# Patient Record
Sex: Female | Born: 1945 | Race: White | Hispanic: No | Marital: Married
Health system: Northeastern US, Academic
[De-identification: ages and names within clinical notes are randomized; demographics above are authoritative.]

---

## 2013-04-02 IMAGING — DX CERVICAL SPINE 4 VIEWS
1 series · 4 of 4 positions shown · non-contrast
Comparison: none

CERVICAL SPINE, AP, LATERAL AND OBLIQUE VIEWS, 04/02/13:
HISTORY: Pain without trauma.

[Series 1: lateral · U · 0.14mm/px · 4 of 4 slices shown]
[im 1/4]
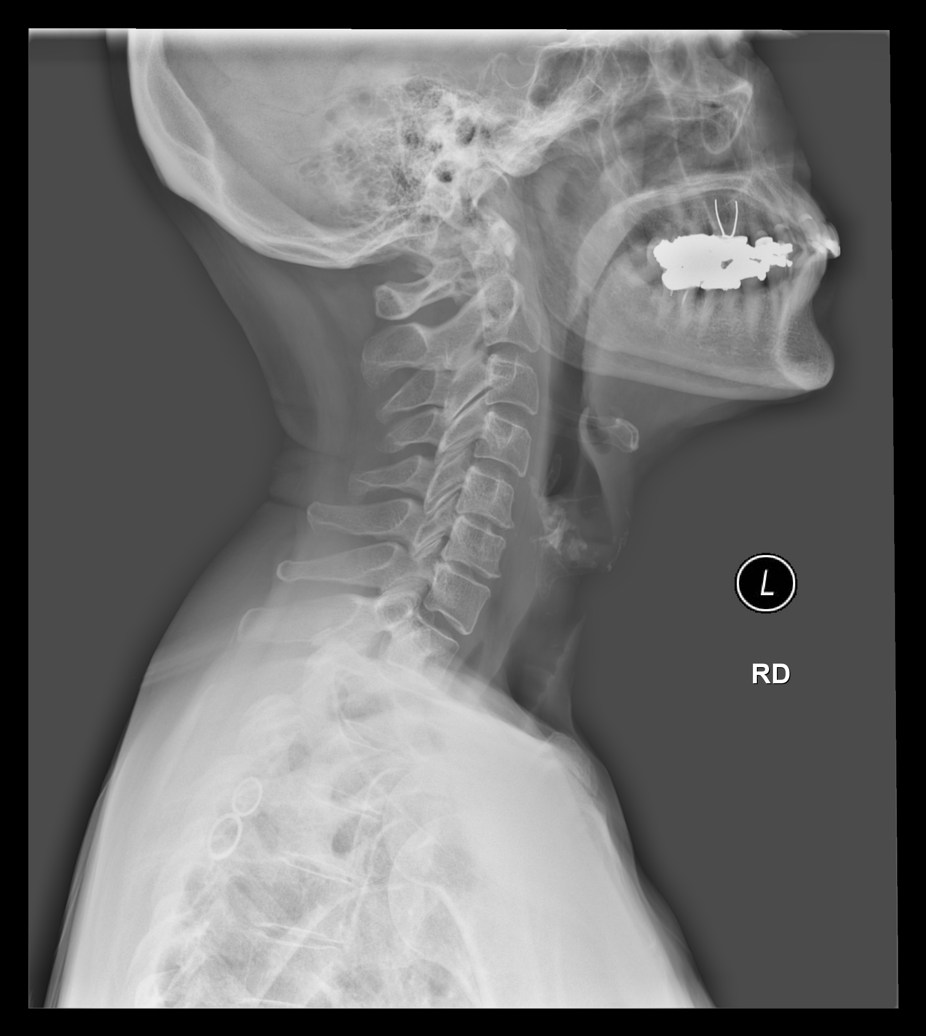
[im 2/4]
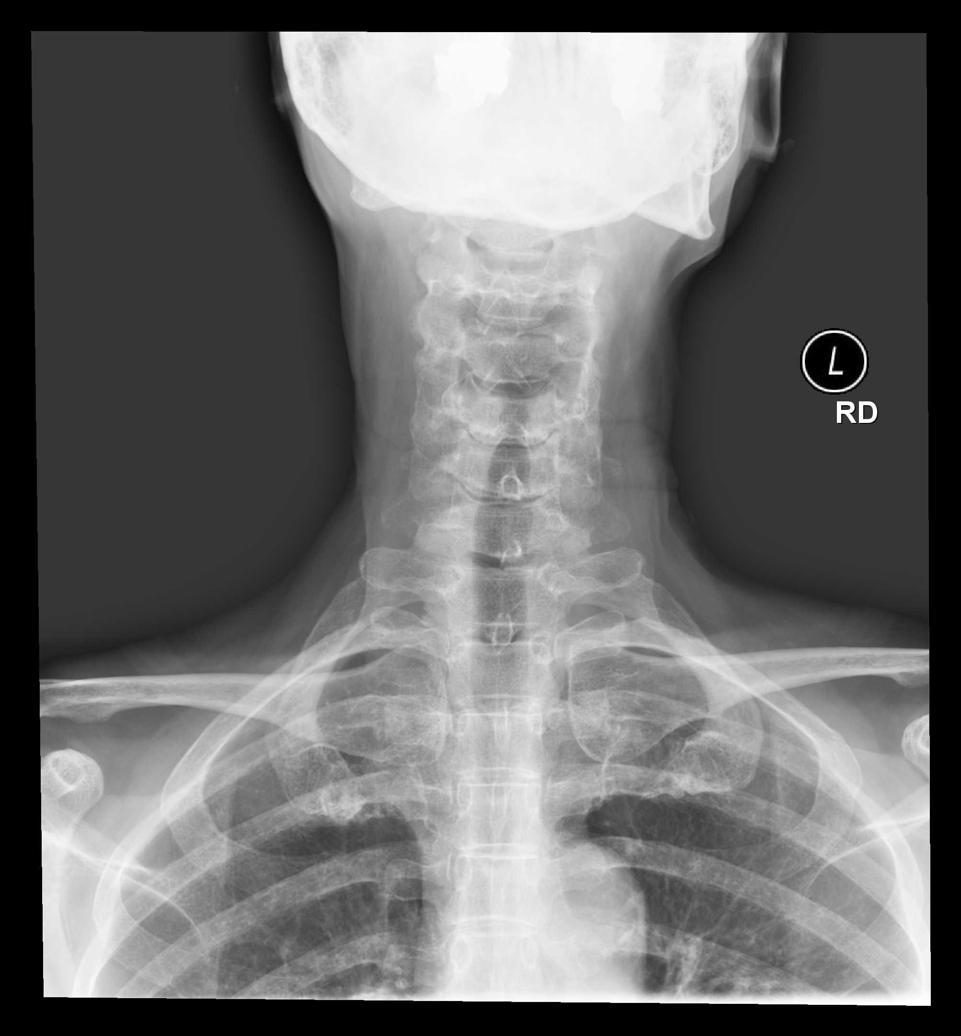
[im 3/4]
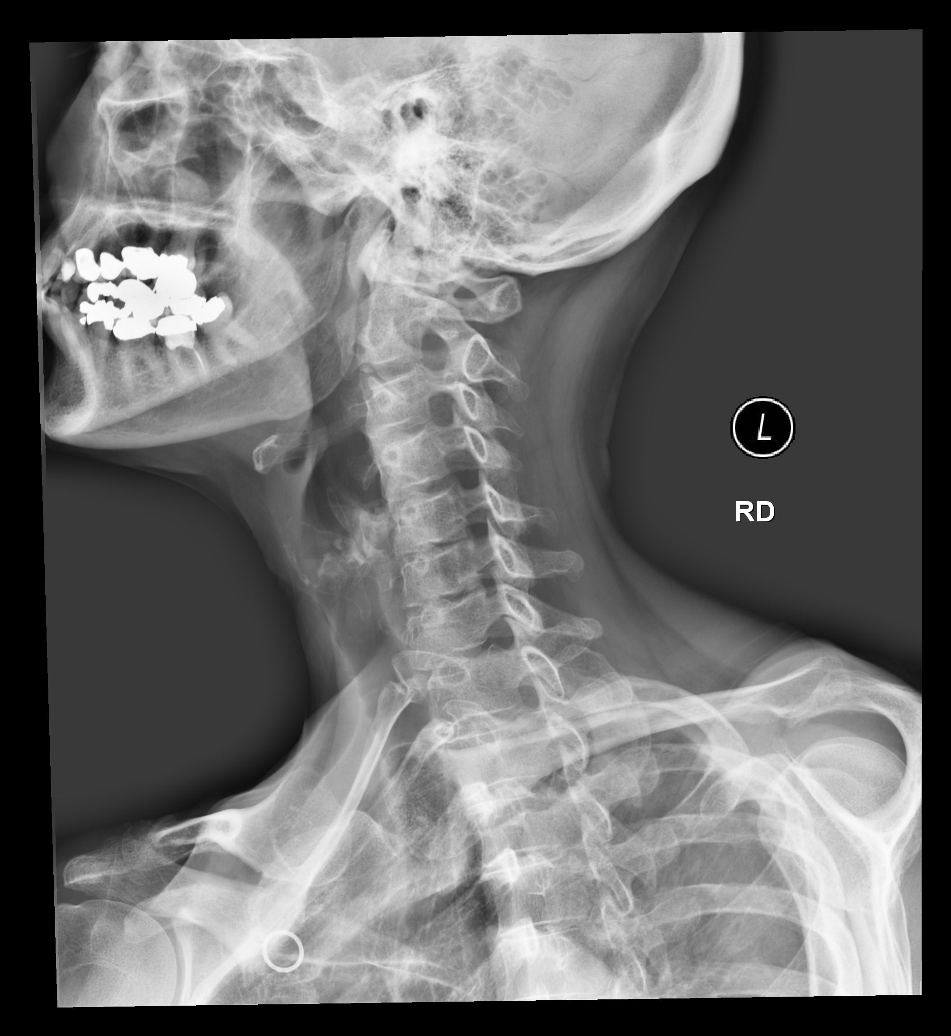
[im 4/4]
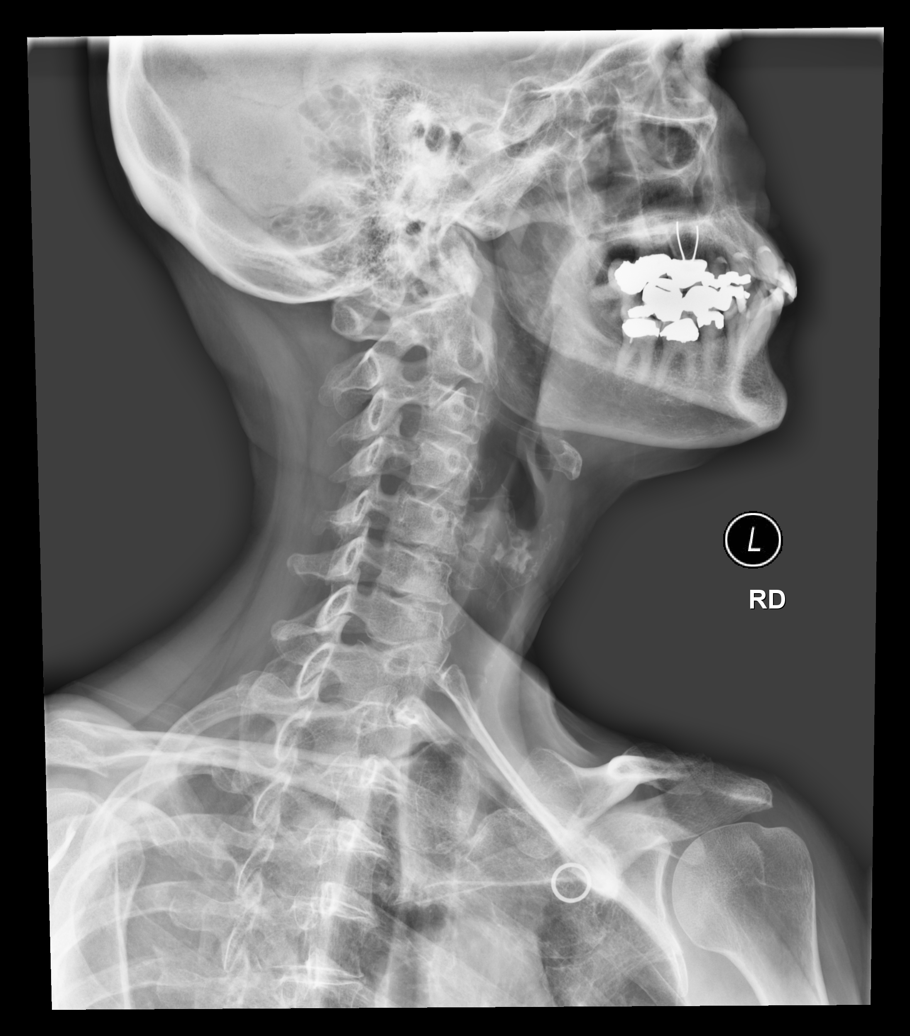

[4 of 4 positions shown; findings below may reference images not displayed]

FINDINGS: There is slight cervical dextrocurvature which could be
positional or reflect a mild degree of spasm.  Oblique views show mild left
C6-7 foraminal narrowing due to uncovertebral spurring.  Right-sided
foramina are open.  There is moderate disc narrowing at C6-7, mild to
moderate at C5-6.  There is slight retrolisthesis at C5-6.  There is no
evidence for fracture.  The dens is intact.
IMPRESSION: Spondylosis.  There is mild left C6-7 foraminal stenosis.  No evidence for
fracture.

## 2015-04-25 IMAGING — DX ABDOMEN 1 VIEW
1 series · 1 of 1 positions shown · non-contrast
Comparison: None prior.

ABDOMEN 1 VIEW, 04/25/2015 [DATE]:
CLINICAL INDICATION: Low back pain which radiates anteriorly with bladder pain
for one month.

[AP]
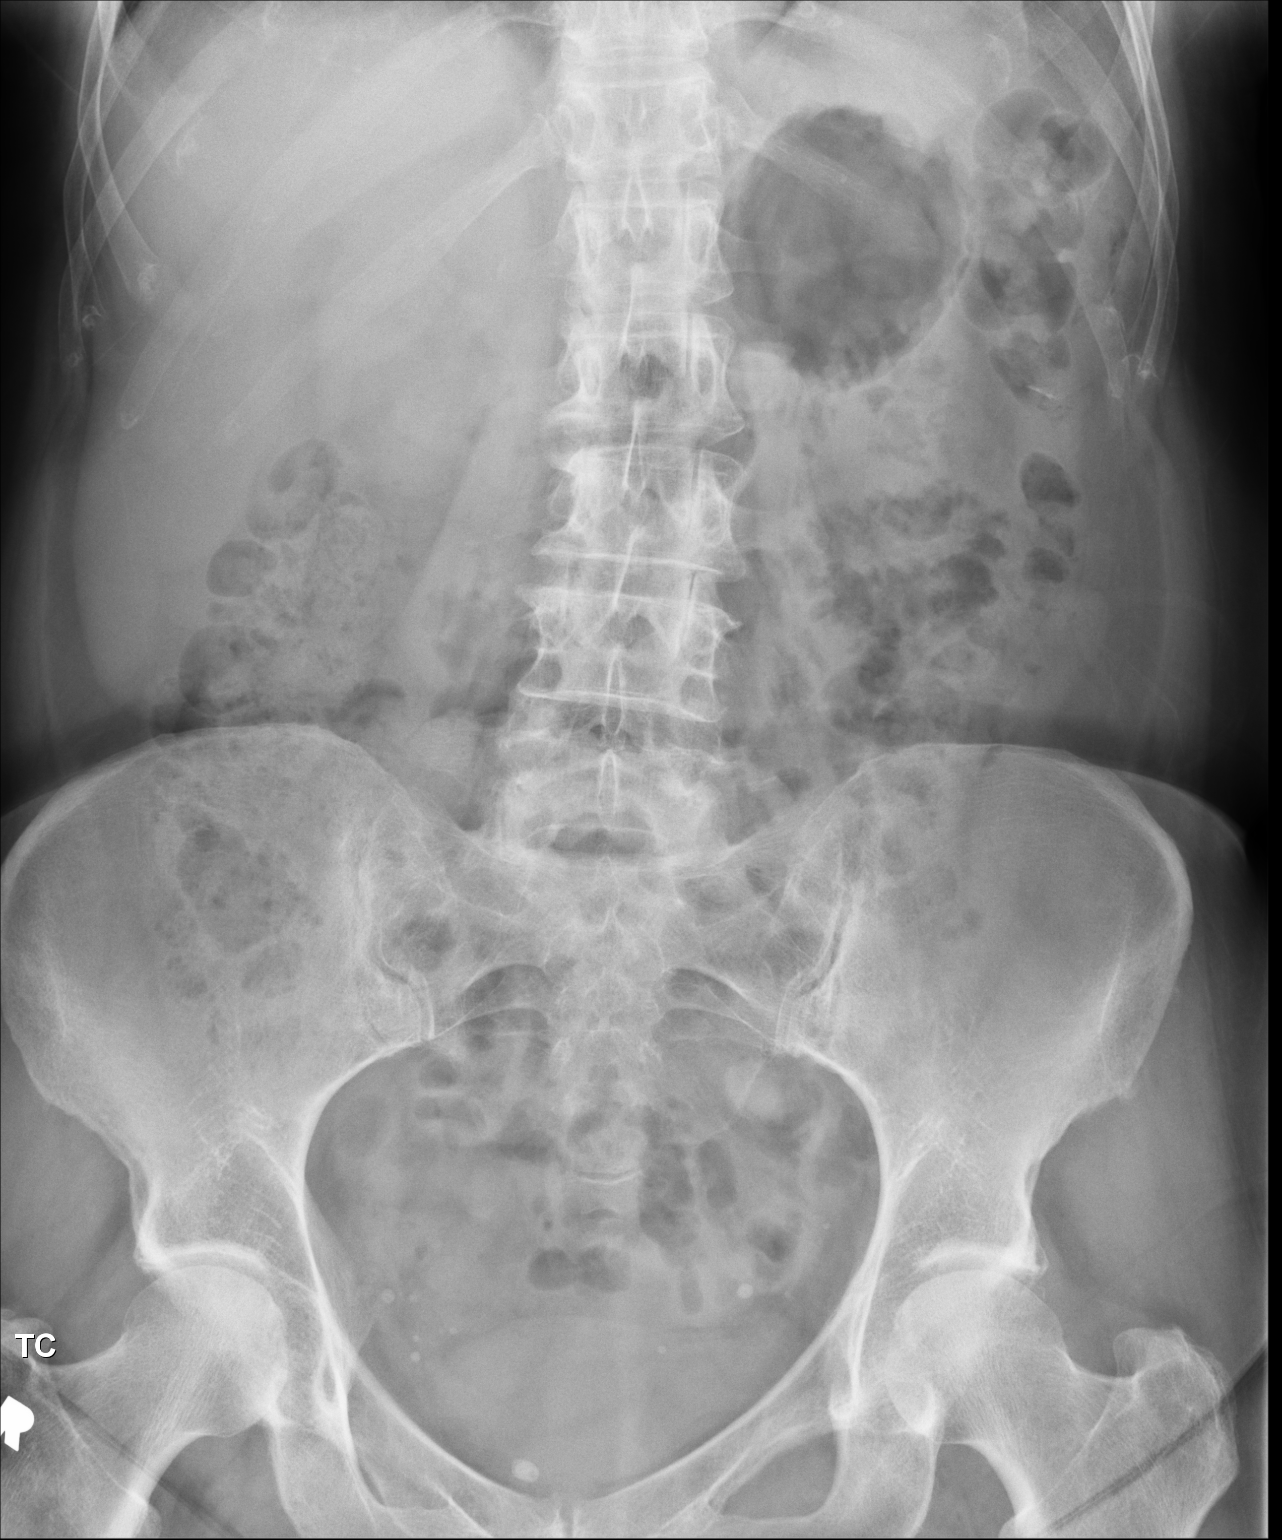

[1 of 1 positions shown; findings below may reference images not displayed]

FINDINGS: No discrete renal, ureteral or bladder stones. Overlying bowel gas
and
stool obscure some detail. Pelvic phleboliths. Mild spondylotic changes lumbar
spine.
IMPRESSION: No discrete urinary calculi.

## 2015-05-02 IMAGING — NM BONE SCAN WHOLE BODY
3 series · 6 of 6 positions shown · non-contrast
Comparison: none

BONE SCAN WHOLE BODY, 05/02/2015 [DATE]:
CLINICAL INDICATION:  Low back pain and bilateral leg pain.
TECHNIQUE: Following the injection of 28.3 mCi 99m MDP scanning of the entire
skeleton was performed.
COMPARISON EXAMINATION:  No pertinent comparison exams.

[whole body · 2.26mm/px · 2 of 2 frames shown]
[frame 1/2]
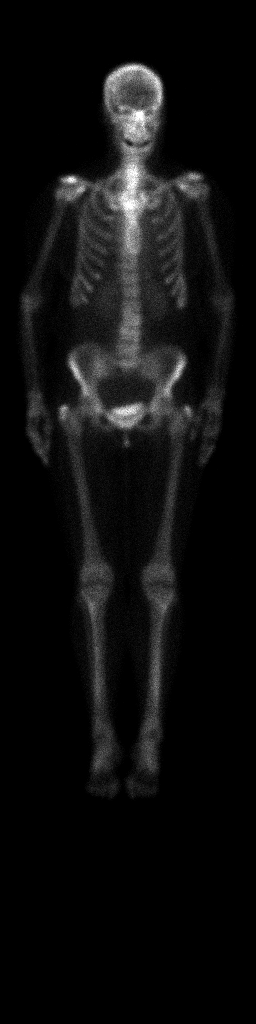
[frame 2/2]
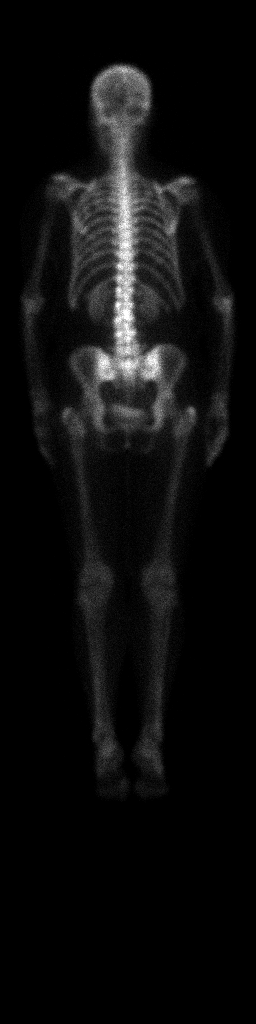

[rao/lpo · 2.26mm/px · 2 of 2 frames shown]
[frame 1/2]
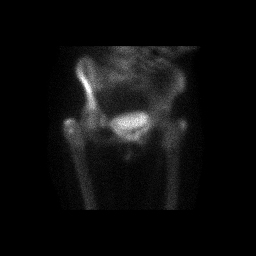
[frame 2/2]
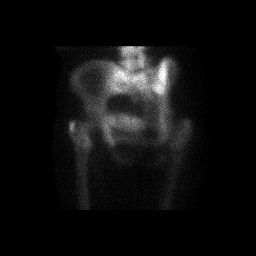

[lao/rpo · 2.26mm/px · 2 of 2 frames shown]
[frame 1/2]
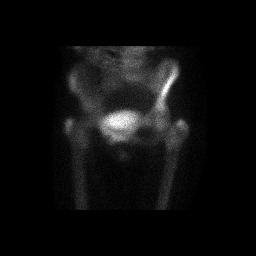
[frame 2/2]
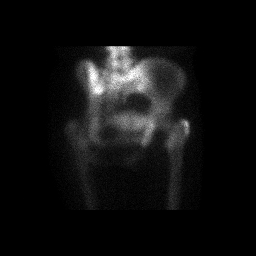

[6 of 6 positions shown; findings below may reference images not displayed]

FINDINGS: There is no scintigraphic evidence for recent occult vertebral
body
or pelvic fracture. There is no scintigraphic evidence for skeletal
metastasis.
There is degenerative activity in the shoulders more so on the right side and
there is degenerative uptake at the greater trochanteric areas bilaterally
potentially indicating gluteal tendinopathy. Otherwise unremarkable.
IMPRESSION: No scintigraphic evidence for occult fracture or skeletal metastasis.

## 2018-10-23 IMAGING — CT CT ABDOMEN AND PELVIS WITH CONTRAST
2 of 3 series · 16 of 46 positions shown, 18 images · IV contrast (isovue)
Comparison: There are no prior exam(s) available for comparison within the past 
12 months; however, comparison was made to the prior exam(s) dated  03/20/2015

CT ABDOMEN AND PELVIS WITH CONTRAST, 10/23/2018 [DATE]: 
CLINICAL INDICATION:  Abdominal pain at the epigastric area and right lower 
quadrant. 
A search for DICOM formatted images was conducted for prior CT imaging studies 
completed at a non-affiliated media free facility.
TECHNIQUE: The abdomen and pelvis were scanned from lung bases through the 
pubic rami with 100 cc's of Isovue 300 injected intravenously on a 
high-resolution Ct scanner using dose reduction techniques.  Routine MPR 
reconstructions were performed.

[Series 4: abd/pel ax w · axial · 0.68mm/px · z∈[-386,-40]mm · 13 of 133 slices shown, 15 images]
[im 9/133  soft-tissue]
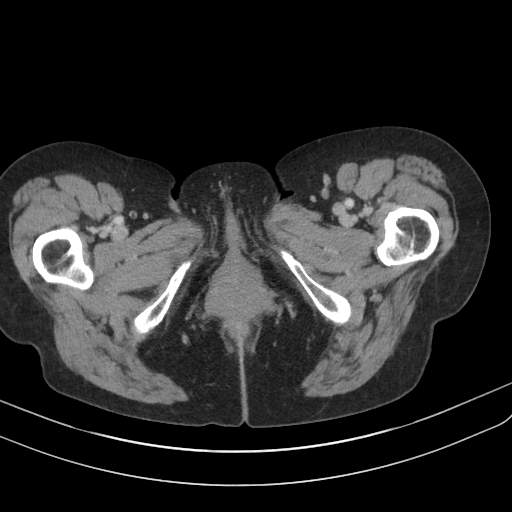
[im 9/133  bone]
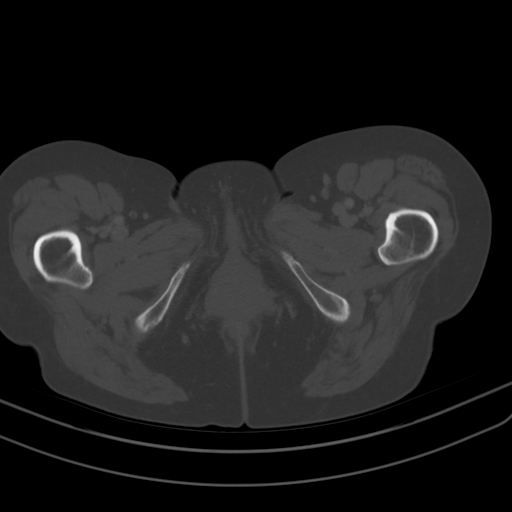
[im 18/133  soft-tissue]
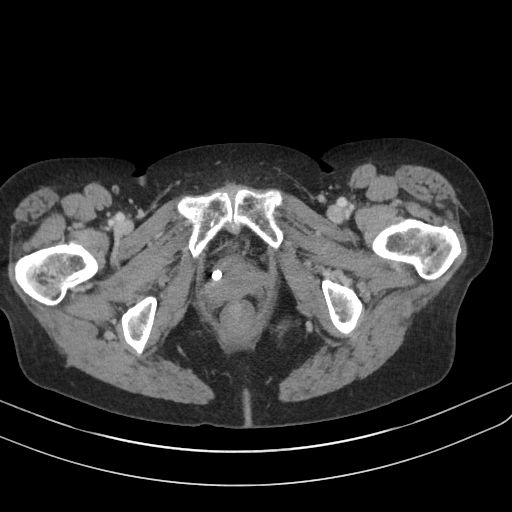
[im 26/133  soft-tissue]
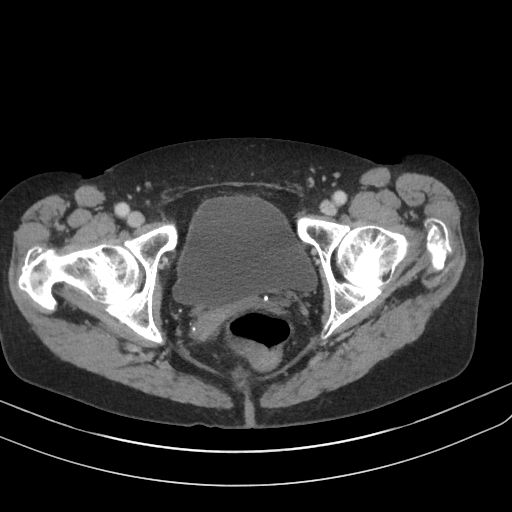
[im 39/133  soft-tissue]
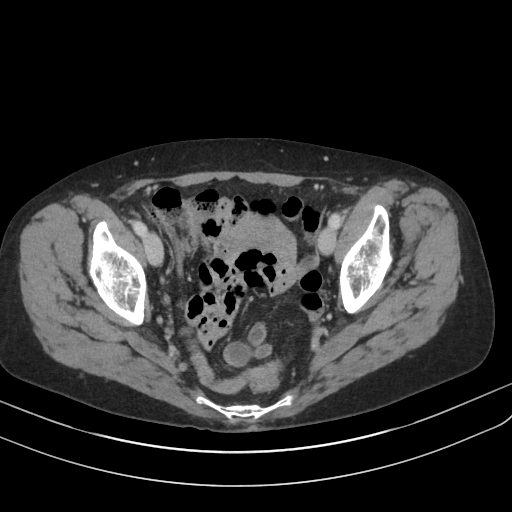
[im 47/133  soft-tissue]
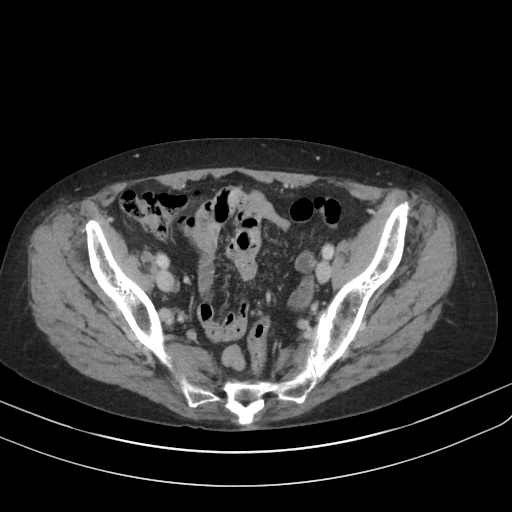
[im 56/133  soft-tissue]
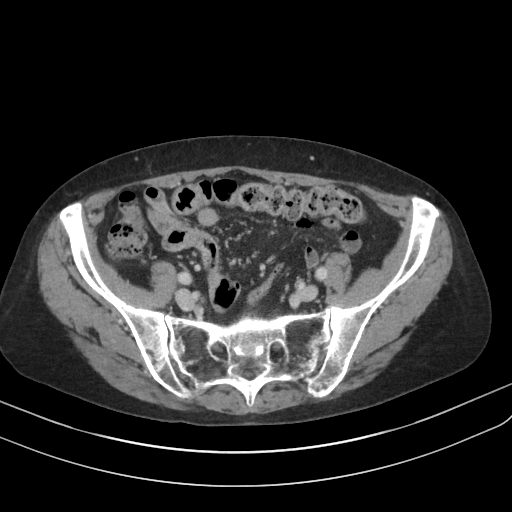
[im 69/133  soft-tissue]
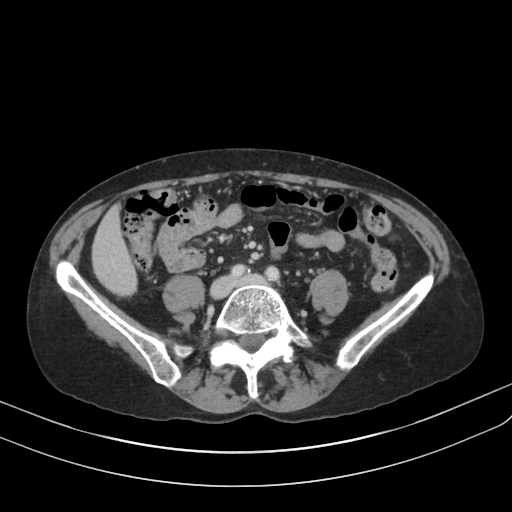
[im 77/133  soft-tissue]
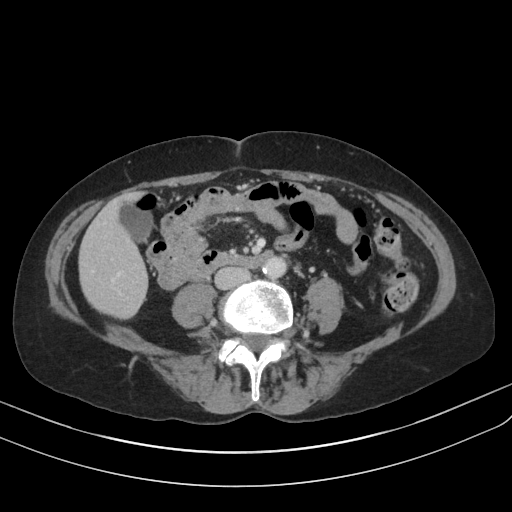
[im 86/133  soft-tissue]
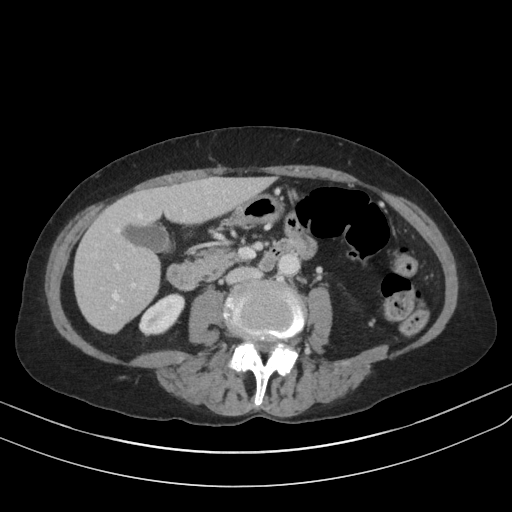
[im 86/133  bone]
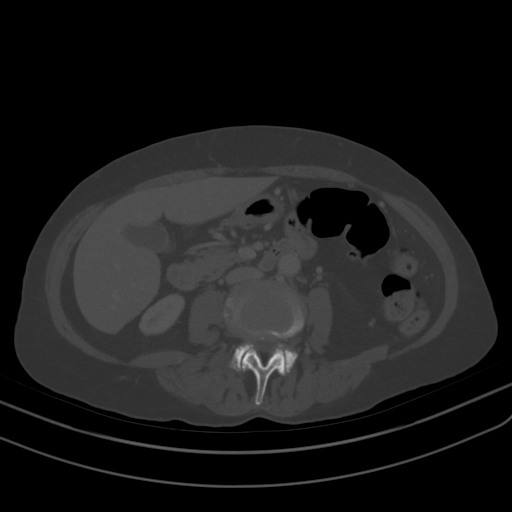
[im 94/133  soft-tissue]
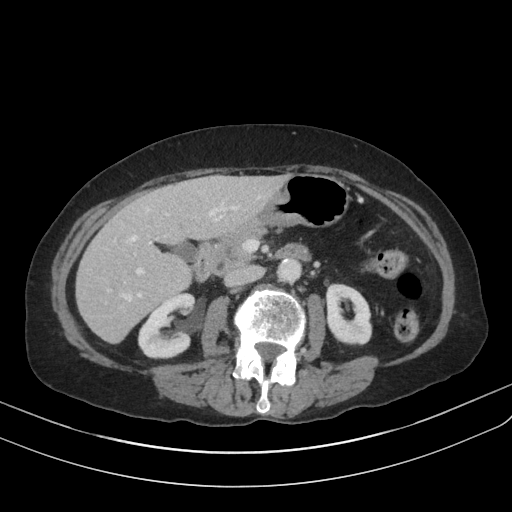
[im 107/133  soft-tissue]
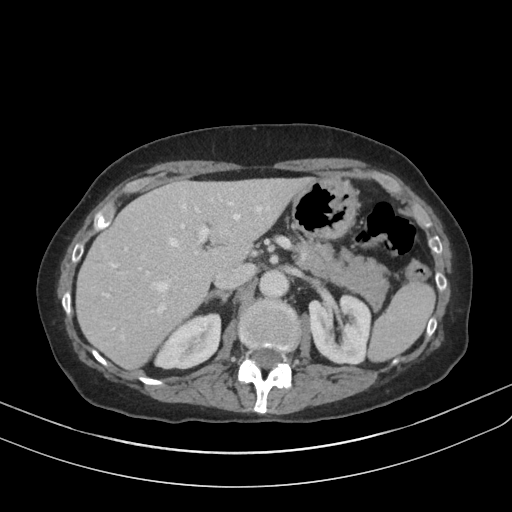
[im 115/133  soft-tissue]
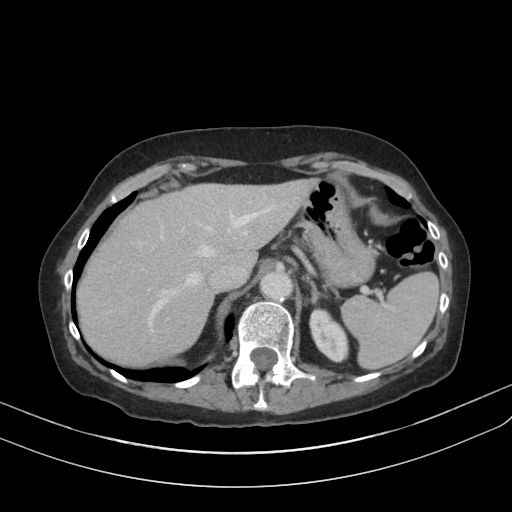
[im 124/133  soft-tissue]
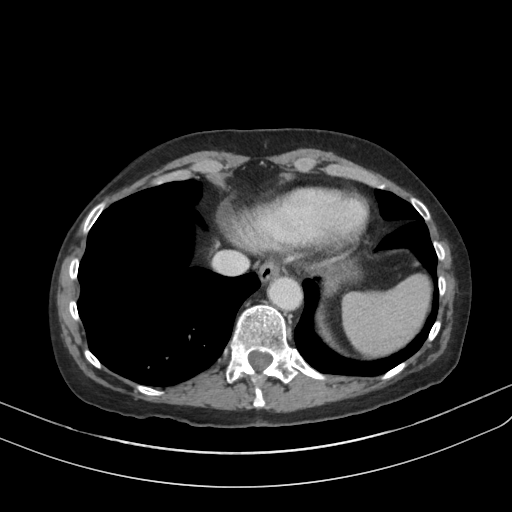

[Series 5: abd/pel cor w · coronal · 0.66mm/px · 3 of 93 slices shown]
[im 31/93  soft-tissue]
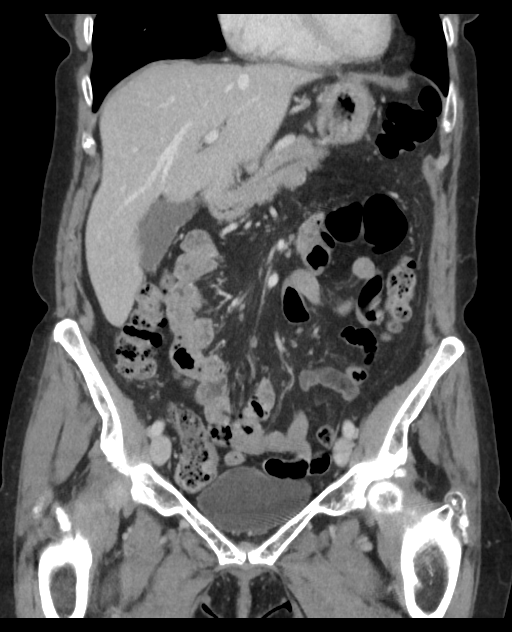
[im 41/93  soft-tissue]
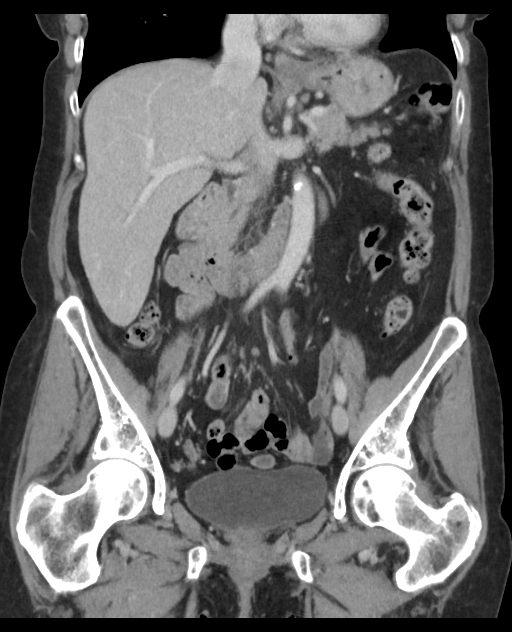
[im 52/93  soft-tissue]
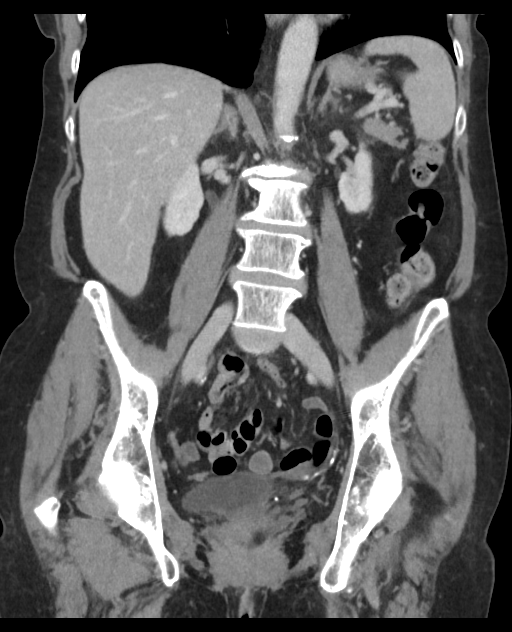

[16 of 46 positions shown; findings below may reference images not displayed]

FINDINGS: The liver and bile ducts are normal. There is no calcified gallstone. 
The adrenals, pancreas and spleen are also normal. Abdominal aorta as well as 
inferior vena cava are normal. Both kidneys are normal in size. There is normal 
thickness of the renal substance with no evidence of renal mass or renal stone. 
There is no hydronephrosis. Ureters are not dilated. No omental or mesenteric 
mass. There is no ascites. No diverticulitis. No pelvic mass
IMPRESSION: Normal CT scan of the abdomen pelvis with contrast. No change since 03/20/2015. 
RADIATION DOSE REDUCTION: All CT scans are performed using radiation dose 
reduction techniques, when applicable.  Technical factors are evaluated and 
adjusted to ensure appropriate moderation of exposure.  Automated dose 
management technology is applied to adjust the radiation doses to minimize 
exposure while achieving diagnostic quality images.

## 2018-12-05 IMAGING — MG MAMMOGRAPHY SCREENING BILATERAL 3D TOMOSYNTHESIS WITH CAD
8 series · 9 of 24 positions shown · non-contrast
Comparison: Mammogram 09/10/2017. 
BREAST DENSITY: (Level B) There are scattered areas of fibroglandular density.

MAMMOGRAPHY SCREENING BILATERAL 3D TOMOSYNTHESIS WITH CAD, 12/05/2018 [DATE]: 
CLINICAL INDICATION: Screening.
TECHNIQUE: Digital bilateral mammograms and 3-D Tomosynthesis were obtained. 
These were interpreted both primarily and with the aid of computer-aided 
detection system.

[L MLO]
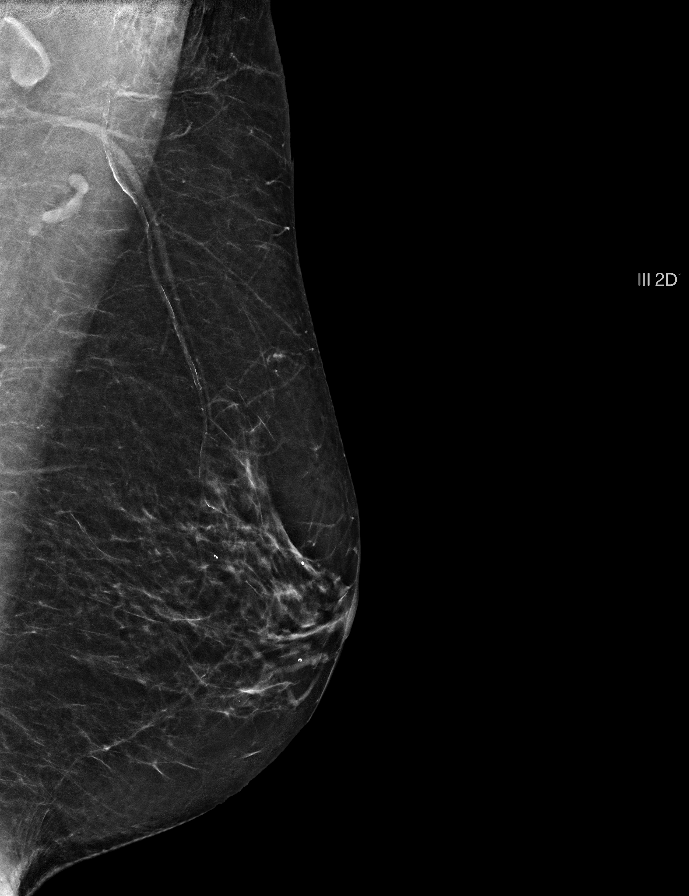

[L CC]
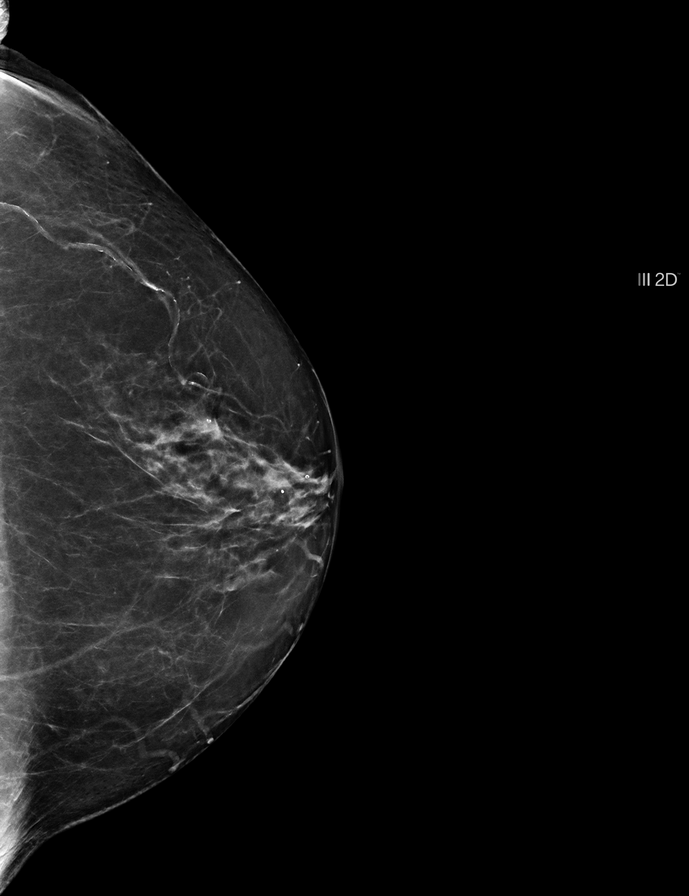

[R CC]
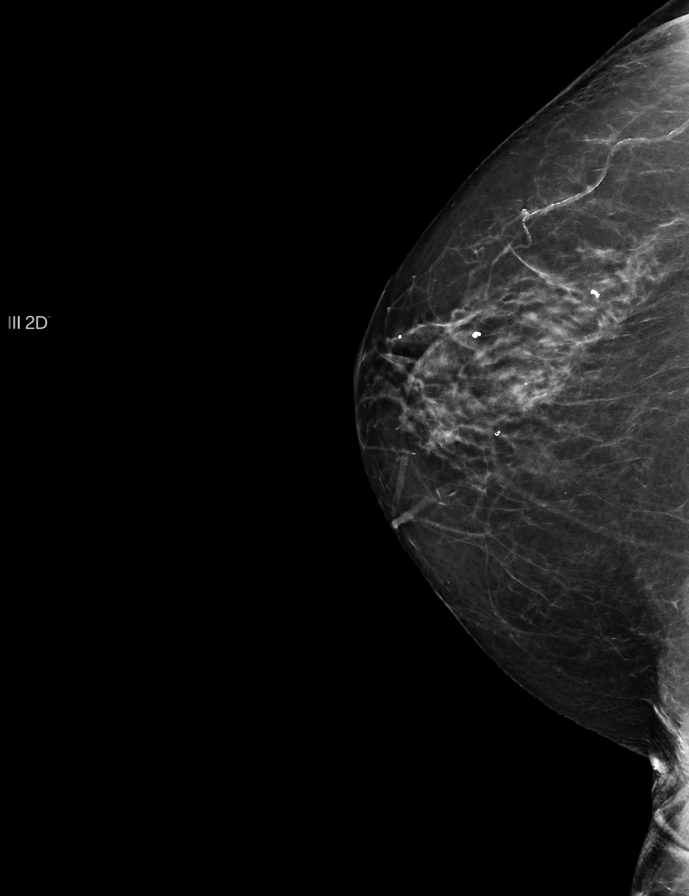

[R MLO]
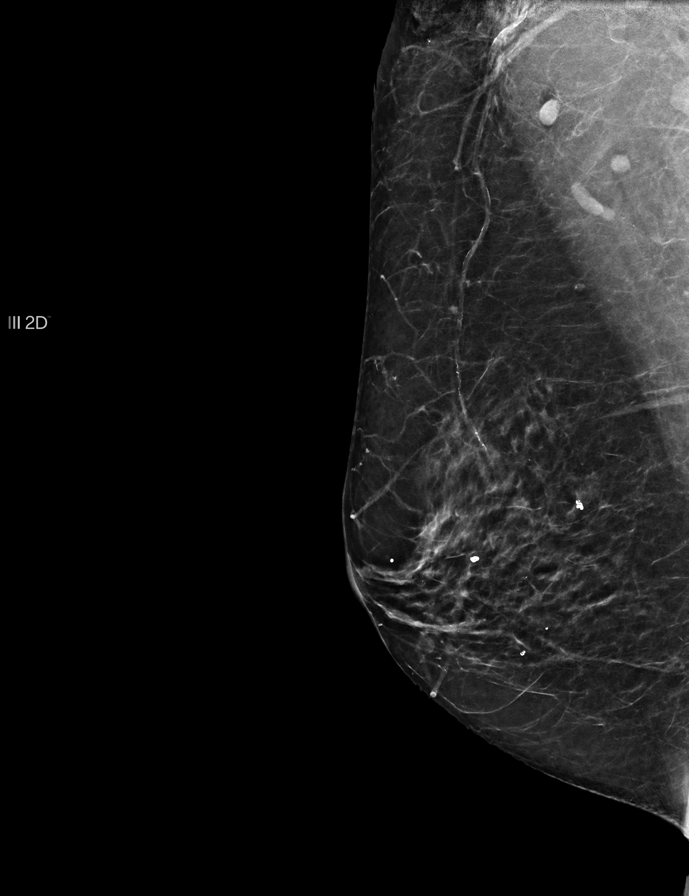

[L MLO tomo · 2 of 61 frames shown]
[frame 20/61]
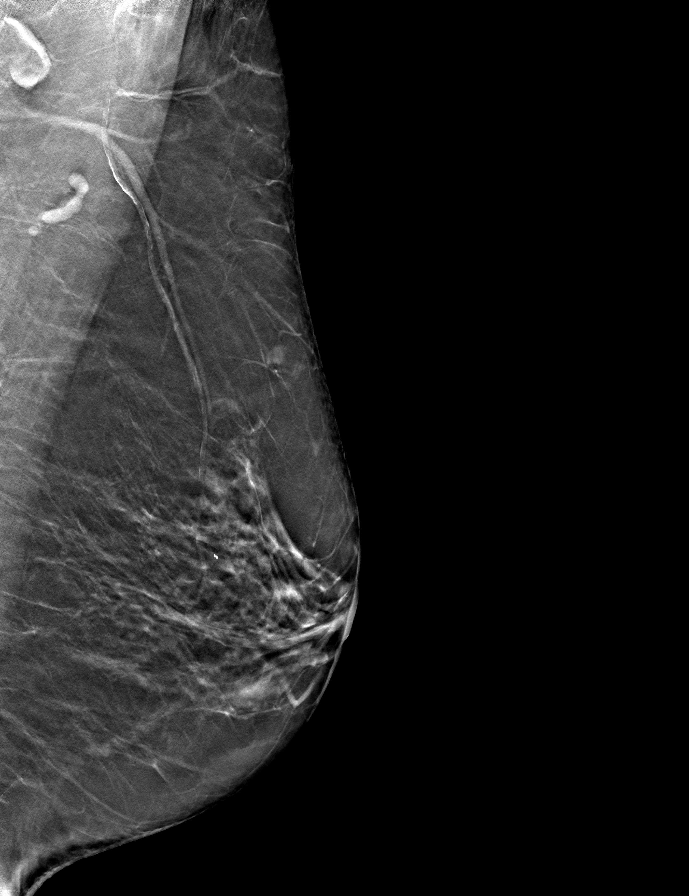
[frame 31/61]
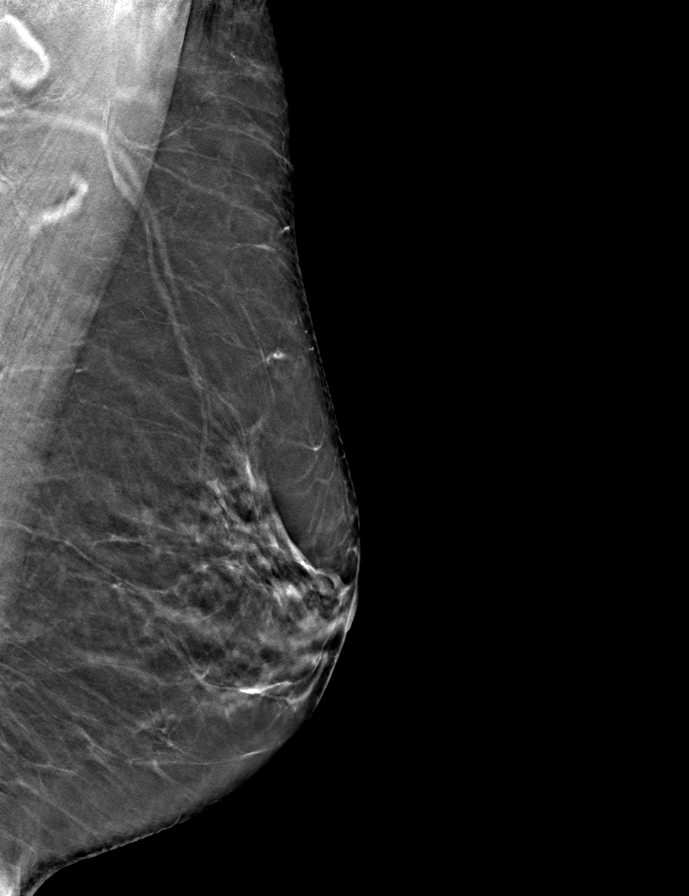

[L CC tomo · tomo slice 34/67.0]
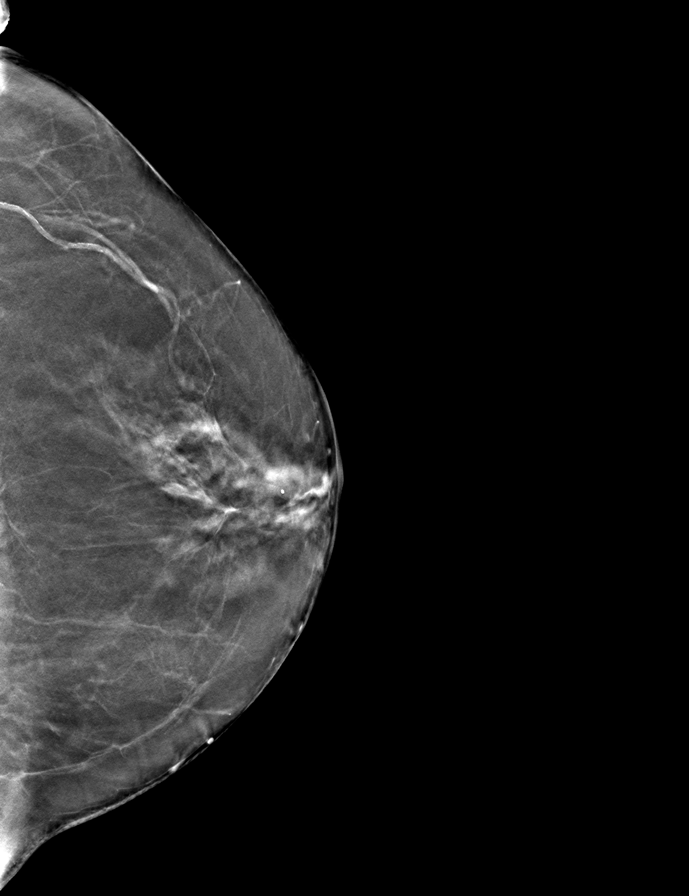

[R MLO tomo · tomo slice 30/59.0]
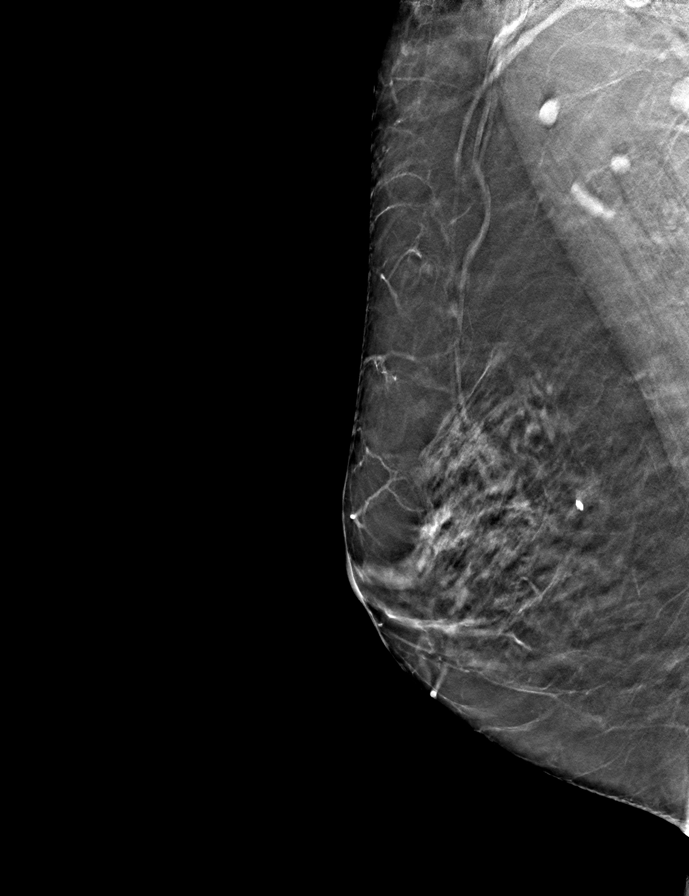

[R CC tomo · tomo slice 31/61.0]
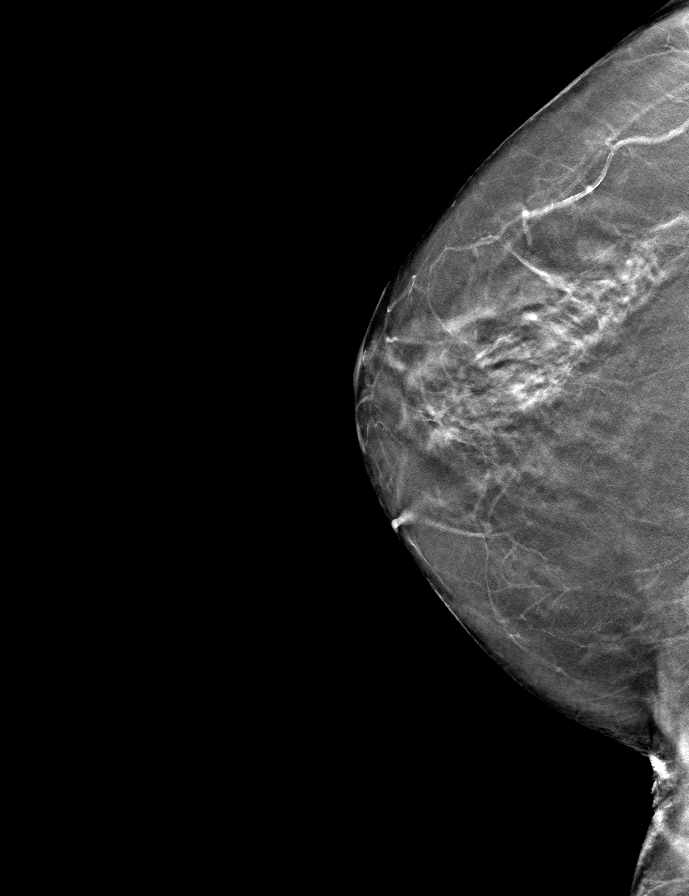

[9 of 24 positions shown; findings below may reference images not displayed]

FINDINGS: No mammographically suspicious abnormality and no significant change.
IMPRESSION: ( BI-RADS 2) Benign findings. Routine mammographic follow-up is recommended.

## 2020-02-01 IMAGING — MG MAMMOGRAPHY SCREENING BILATERAL 3D TOMOSYNTHESIS WITH CAD
8 series · 9 of 24 positions shown · non-contrast
Comparison: Comparison was made to prior exams. 
BREAST DENSITY: (Level B) There are scattered areas of fibroglandular density.

MAMMOGRAPHY SCREENING BILATERAL 3D TOMOSYNTHESIS WITH CAD, 02/01/2020 [DATE]: 
CLINICAL INDICATION: Screening exam.
TECHNIQUE: Digital bilateral mammograms and 3-D Tomosynthesis were obtained. 
These were interpreted both primarily and with the aid of computer-aided 
detection system.

[L CC]
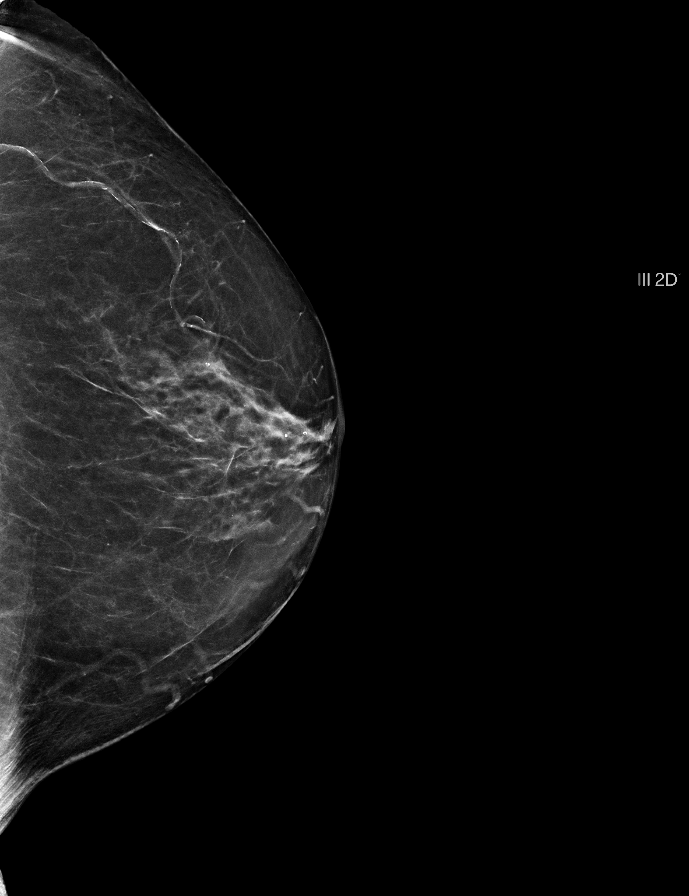

[R MLO]
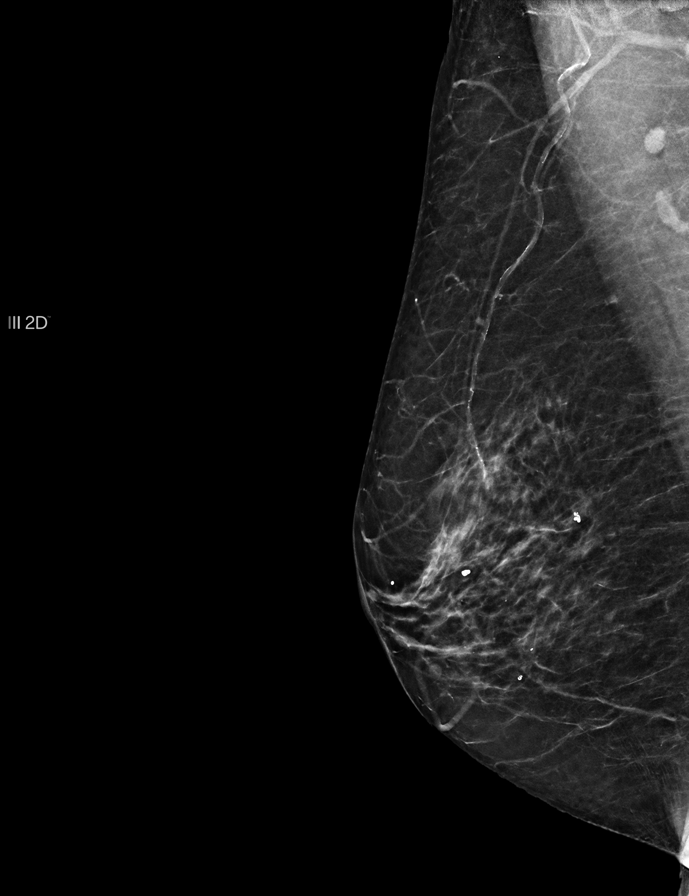

[R CC]
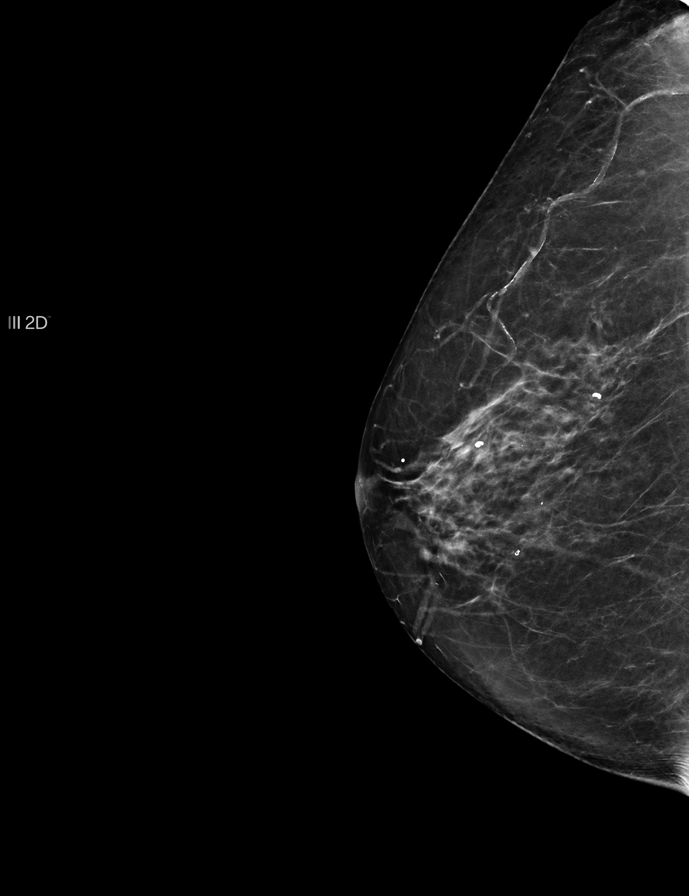

[L MLO]
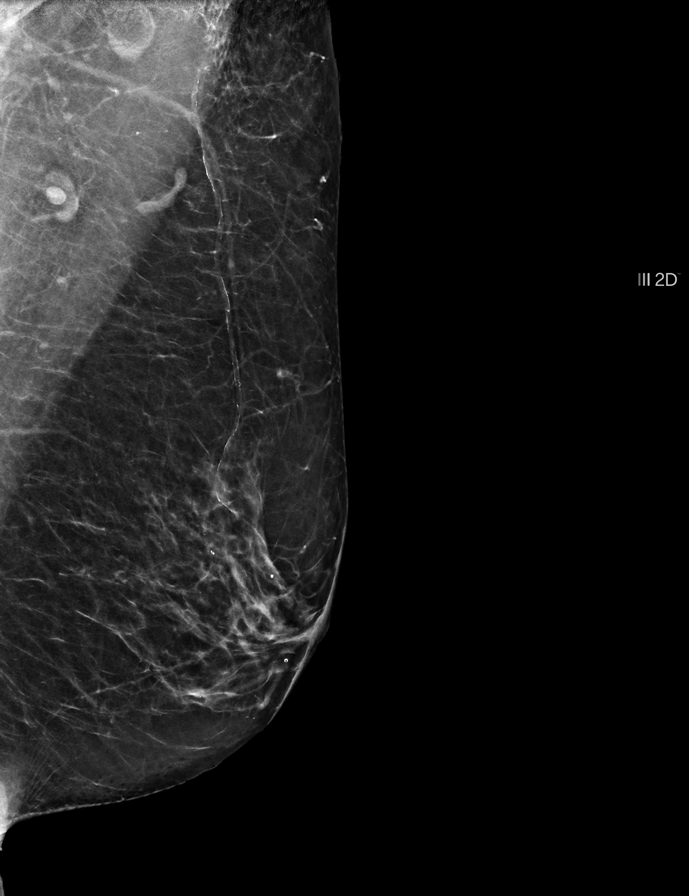

[R CC tomo · 2 of 59 frames shown]
[frame 20/59]
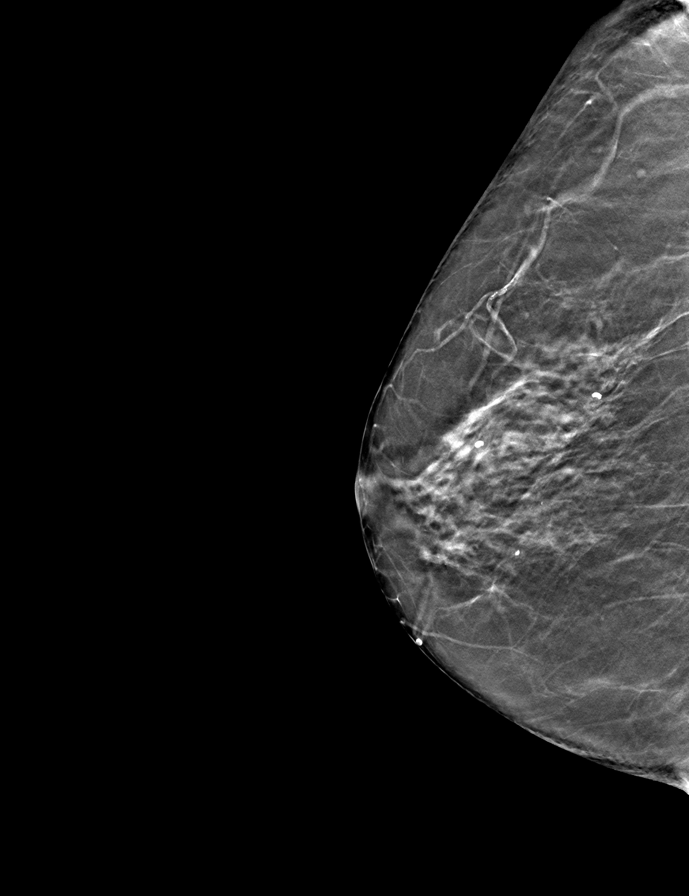
[frame 30/59]
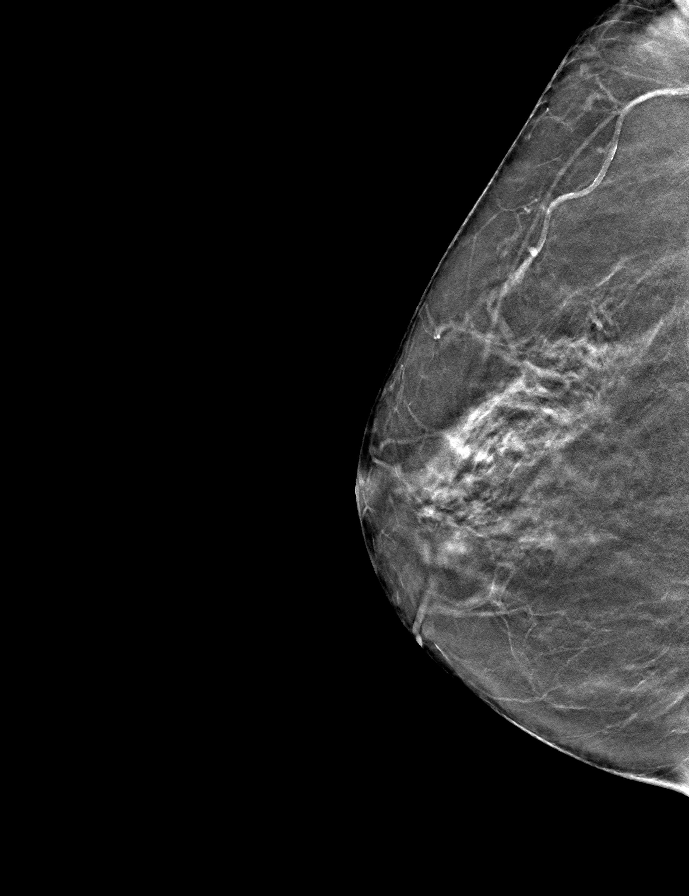

[L MLO tomo · tomo slice 29/58.0]
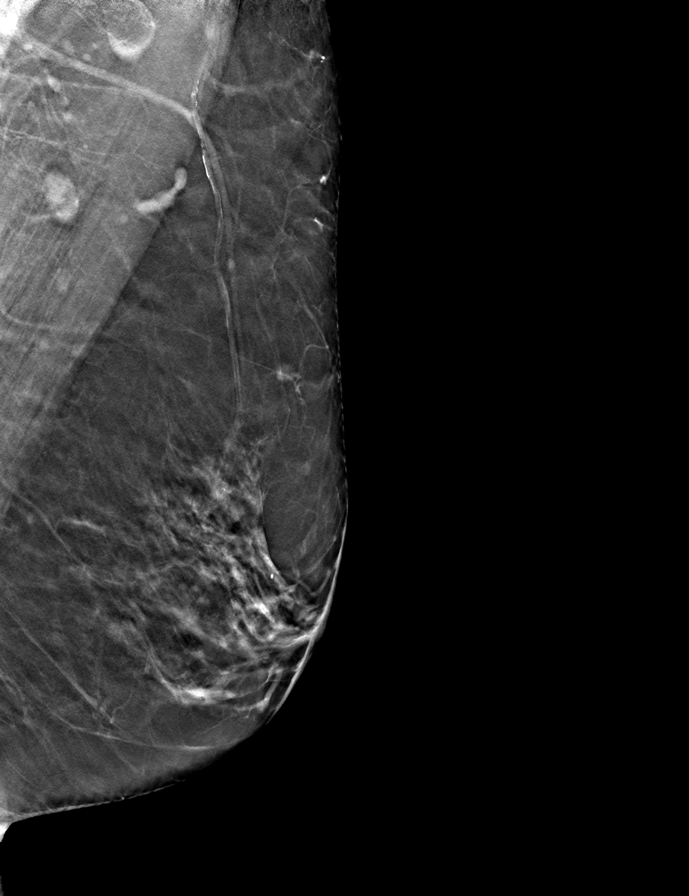

[L CC tomo · tomo slice 31/61.0]
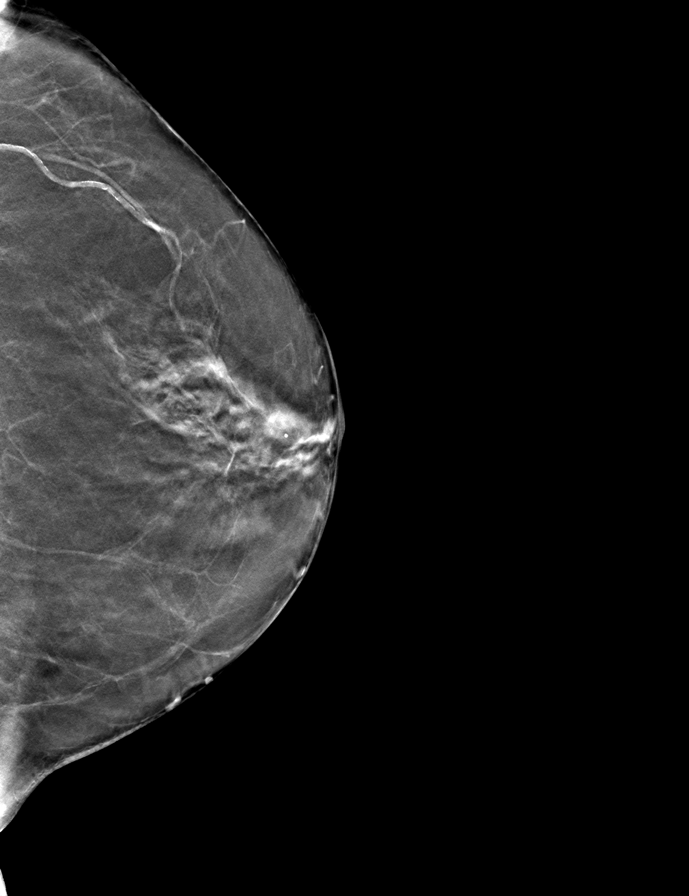

[R MLO tomo · tomo slice 27/53.0]
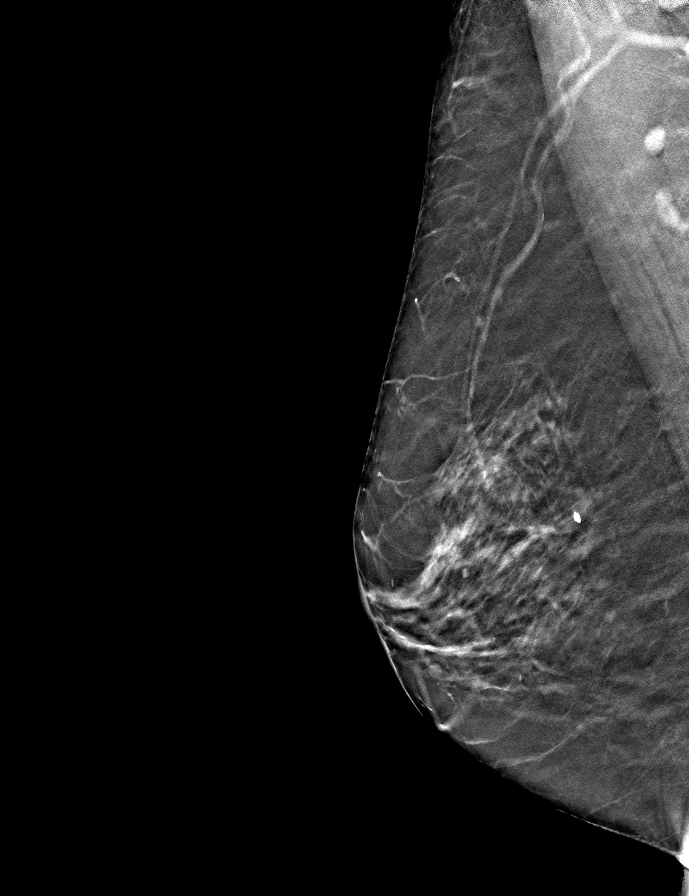

[9 of 24 positions shown; findings below may reference images not displayed]

FINDINGS: No suspicious mass, calcifications, or area of architectural 
distortion in either breast.
IMPRESSION: No mammographic evidence of malignancy in either breast. 
( BI-RADS 1) Negative mammogram. Routine mammographic follow-up is recommended.

## 2020-05-23 ENCOUNTER — Ambulatory Visit

## 2020-06-02 ENCOUNTER — Ambulatory Visit (HOSPITAL_BASED_OUTPATIENT_CLINIC_OR_DEPARTMENT_OTHER)

## 2020-06-02 ENCOUNTER — Ambulatory Visit: Admitting: Pediatric Ophthalmology and Strabismus Specialist

## 2020-06-02 ENCOUNTER — Ambulatory Visit: Admit: 2020-06-02

## 2021-02-02 IMAGING — MG MAMMOGRAPHY SCREENING BILATERAL 3D TOMOSYNTHESIS WITH CAD
8 series · 9 of 24 positions shown · non-contrast
Comparison: Comparison was made to prior examinations.

MAMMOGRAPHY SCREENING BILATERAL 3D TOMOSYNTHESIS WITH CAD, 02/02/2021 [DATE]: 
CLINICAL INDICATION: Screening exam. Bilateral excisional breast biopsies, 
negative.
TECHNIQUE: Digital bilateral mammograms and 3-D Tomosynthesis were obtained. 
These were interpreted both primarily and with the aid of computer-aided 
detection system. 
BREAST DENSITY: (Level B) There are scattered areas of fibroglandular density.

[R MLO]
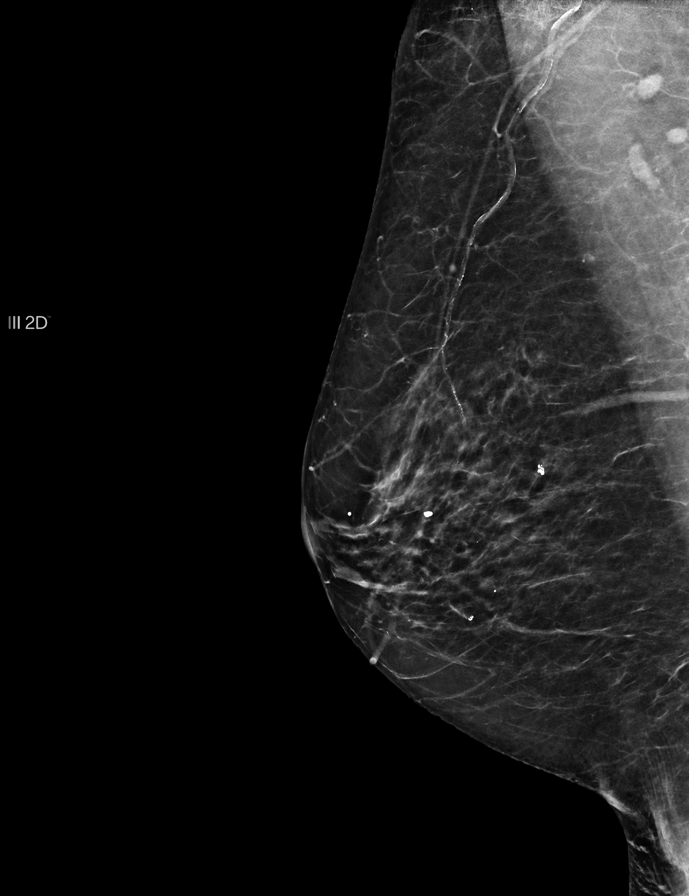

[L MLO]
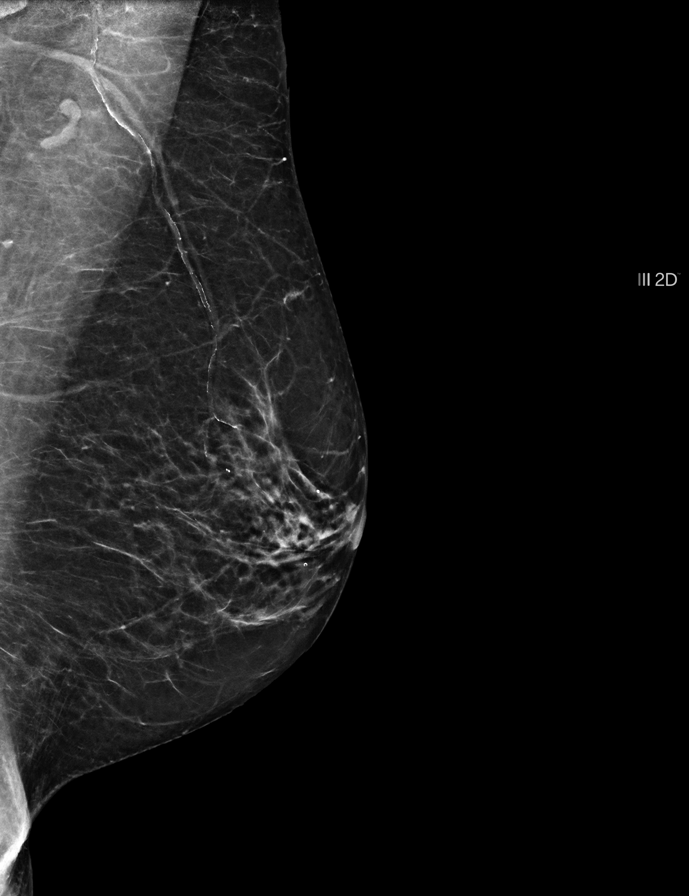

[R CC]
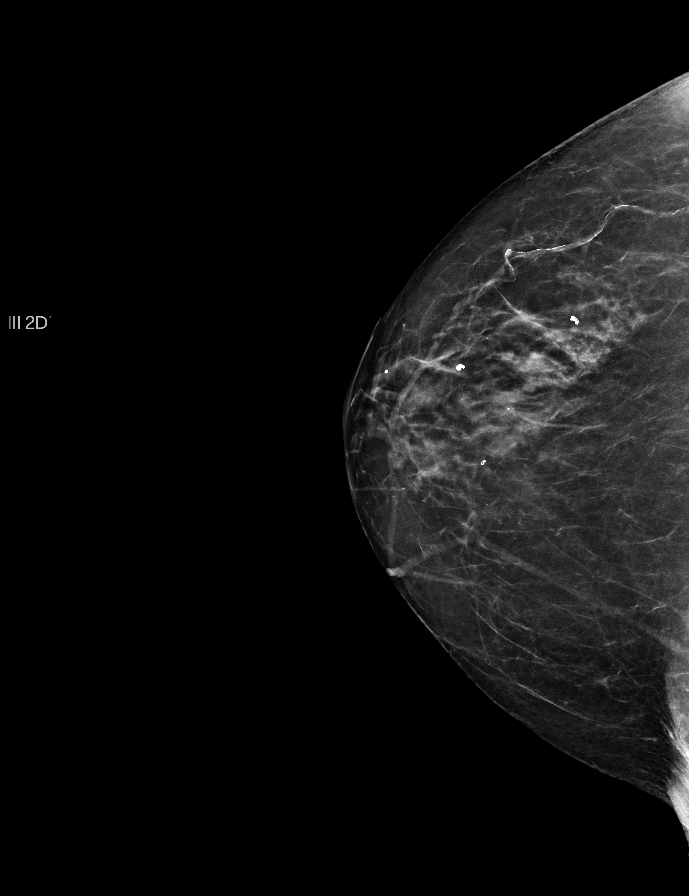

[L CC]
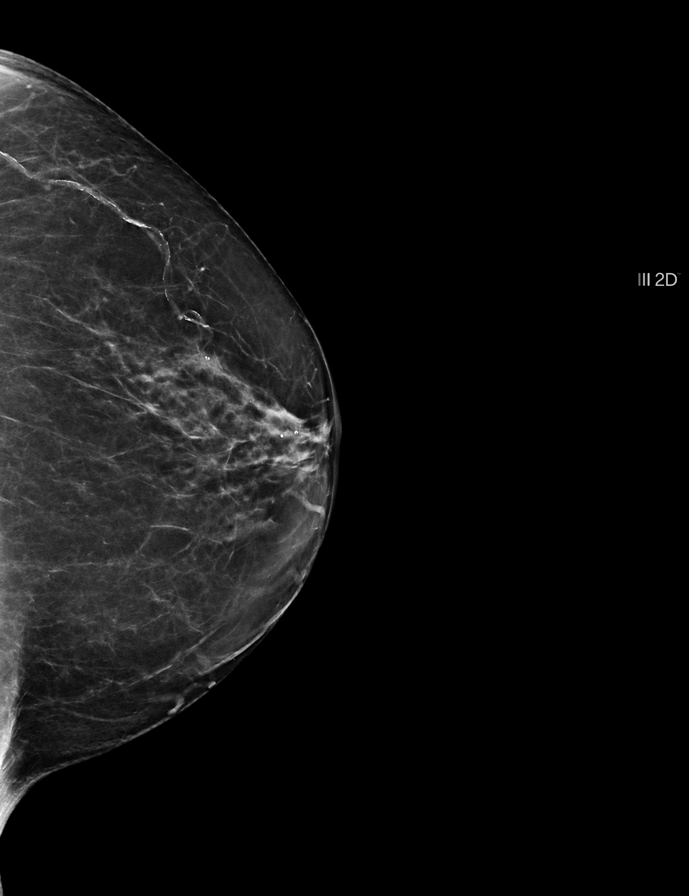

[R MLO tomo · 2 of 55 frames shown]
[frame 18/55]
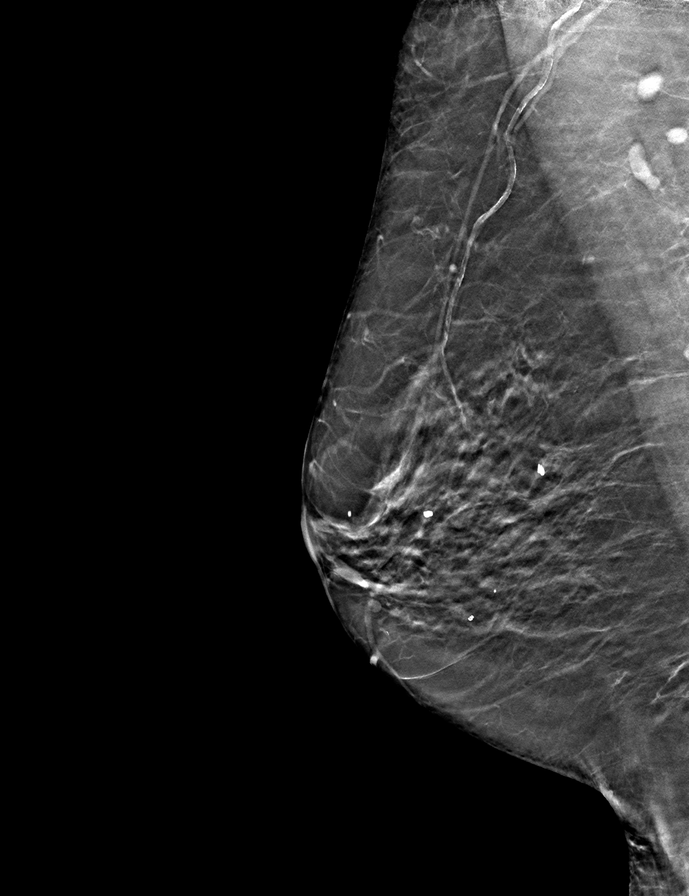
[frame 28/55]
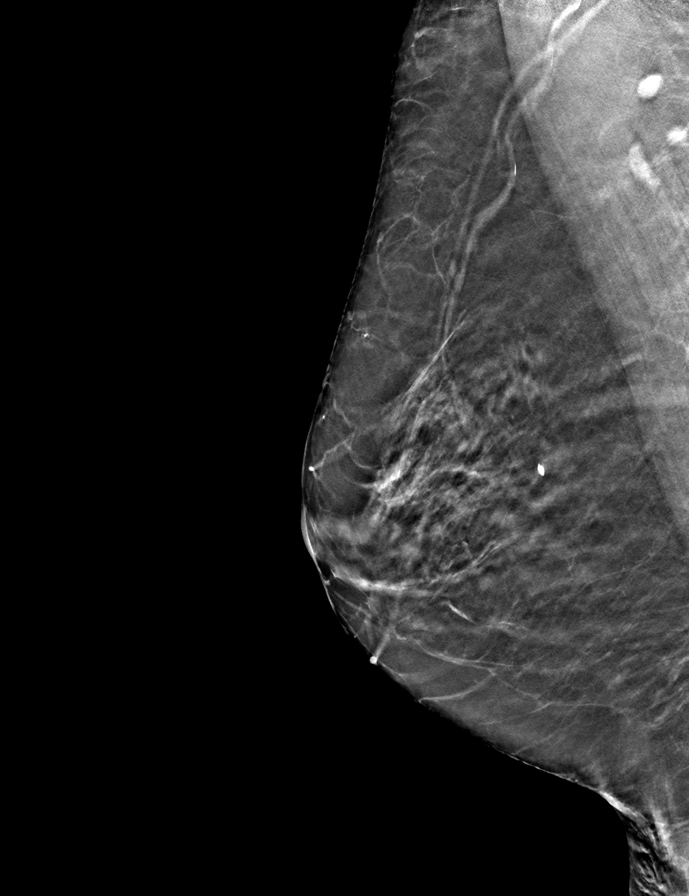

[R CC tomo · tomo slice 31/60.0]
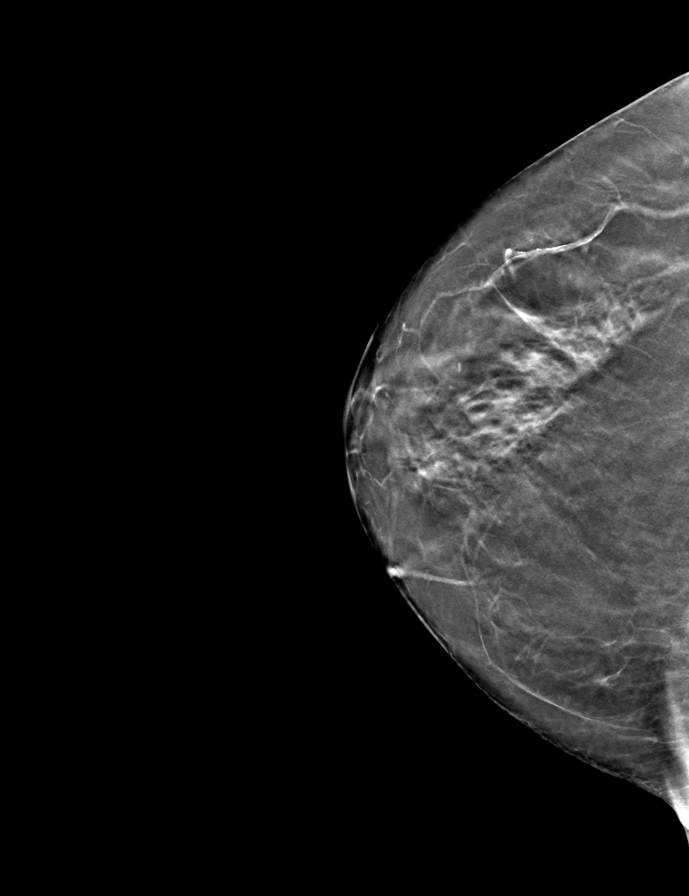

[L MLO tomo · tomo slice 31/60.0]
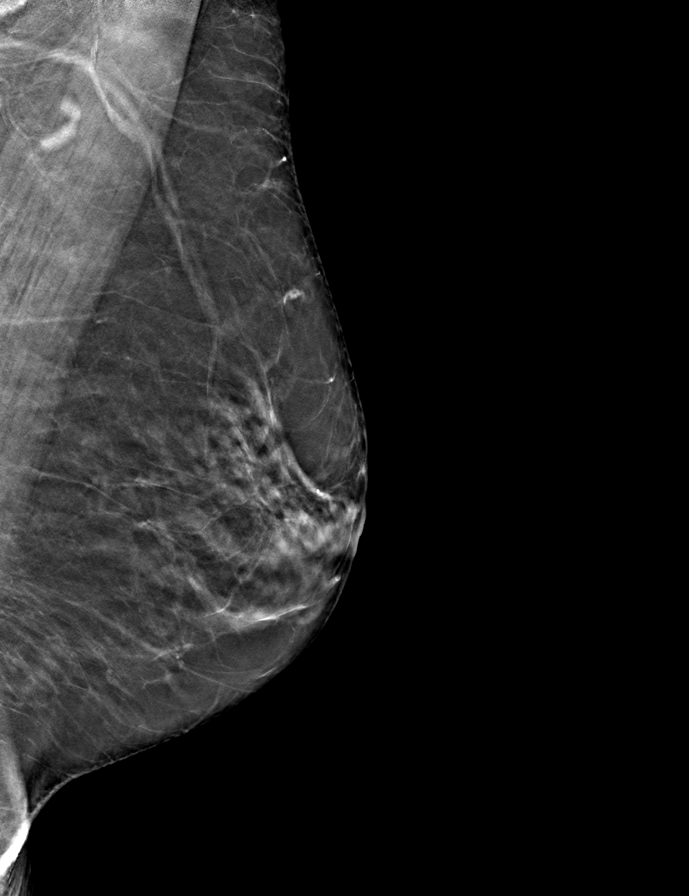

[L CC tomo · tomo slice 30/59.0]
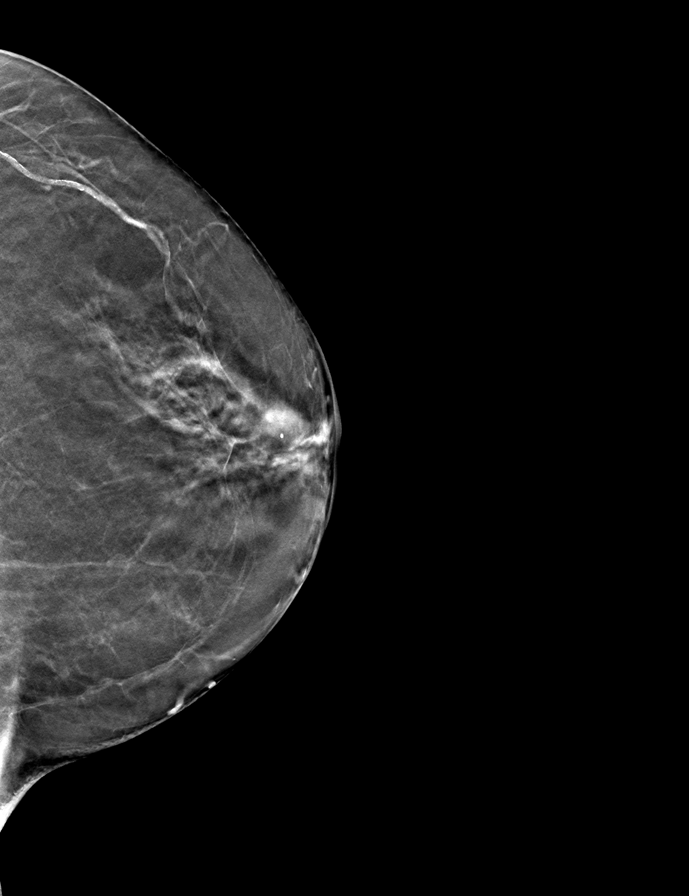

[9 of 24 positions shown; findings below may reference images not displayed]

FINDINGS: No suspicious mass, calcifications, or area of architectural 
distortion in either breast.
IMPRESSION: No mammographic evidence of malignancy in either breast. 
( BI-RADS 2) Benign findings. Routine mammographic follow-up is recommended.

## 2021-02-02 IMAGING — CT CT ORBITS W/WO CONTRAST
2 of 5 series · 14 of 40 positions shown, 17 images · IV contrast (ISOVUE 300)
Comparison: None.

CT ORBITS W/WO CONTRAST, 02/02/2021 [DATE]: 
CLINICAL INDICATION:  74-year-old female with dynamic flow. A and possible 
thyroid eye disease. 
A search for DICOM formatted images was conducted for prior CT imaging studies 
completed at a non-affiliated media free facility.
TECHNIQUE: The face was scanned without contrast on a high-resolution CT 
scanner using dose reduction techniques. Routine MPR reconstructions were 
performed. The patient's eGFR was calculated to be 69.7 using the i-STAT device.

[Series 4: orbit/face bone 0.6 j80s 1 · axial · 0.32mm/px · z∈[+1149,+1234]mm · 11 of 160 slices shown, 14 images]
[im 9/160  brain]
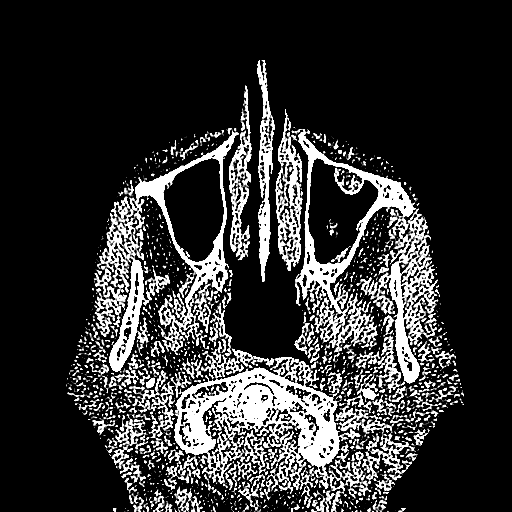
[im 9/160  bone]
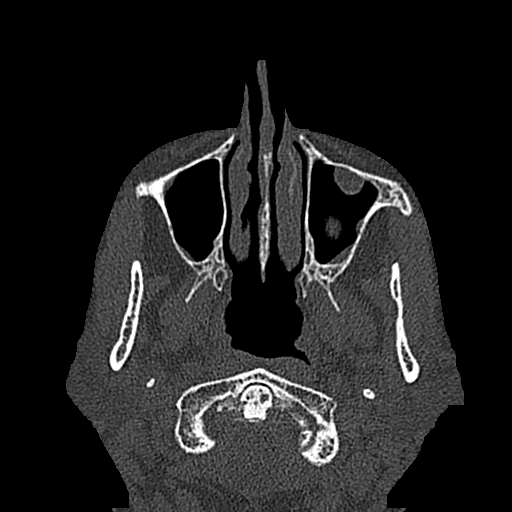
[im 27/160  bone]
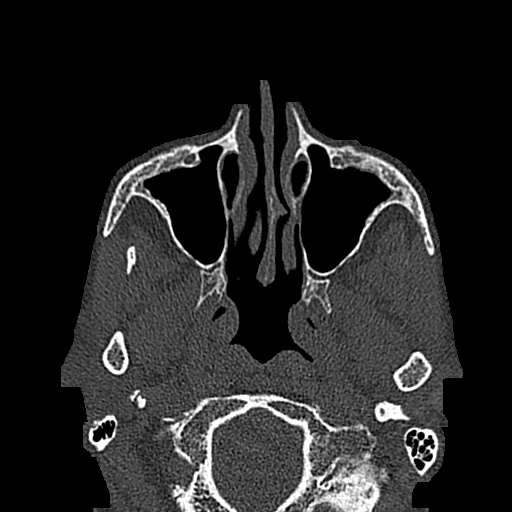
[im 36/160  bone]
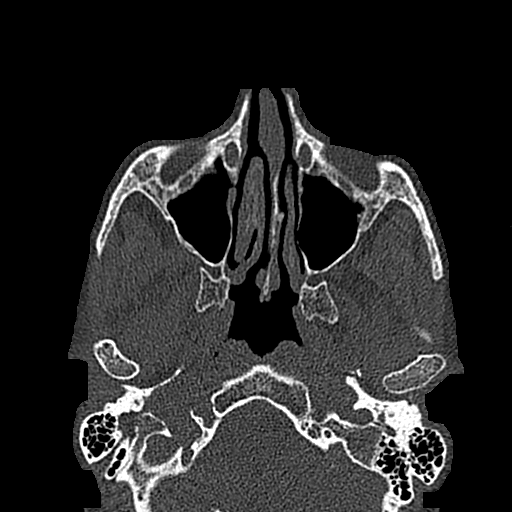
[im 54/160  bone]
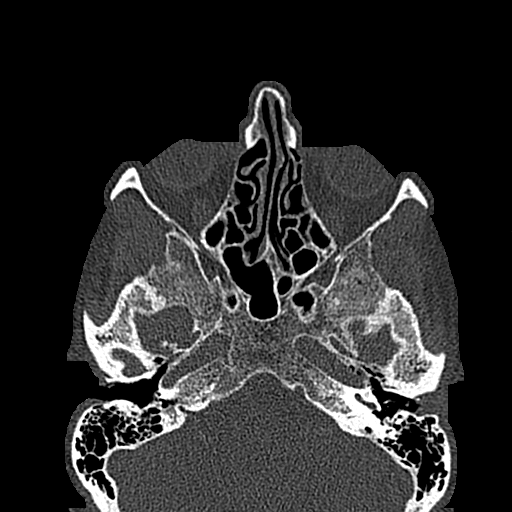
[im 62/160  brain]
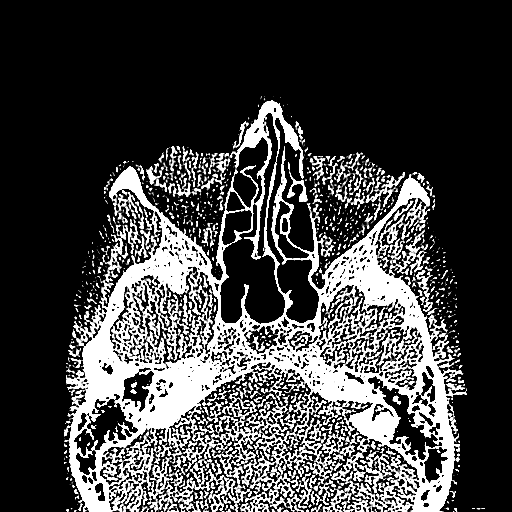
[im 62/160  bone]
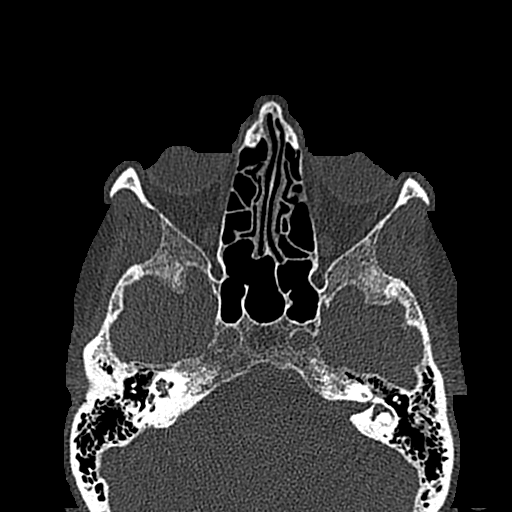
[im 80/160  bone]
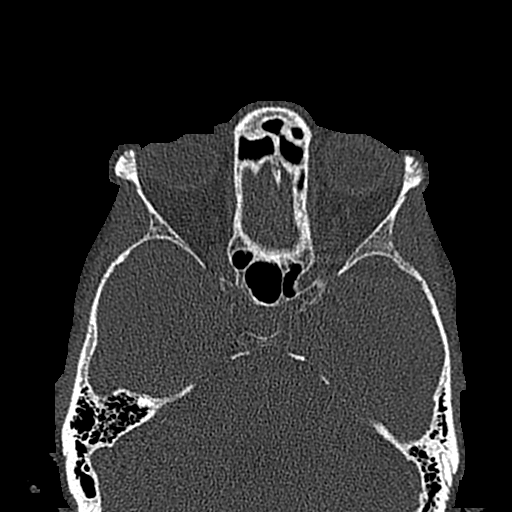
[im 98/160  bone]
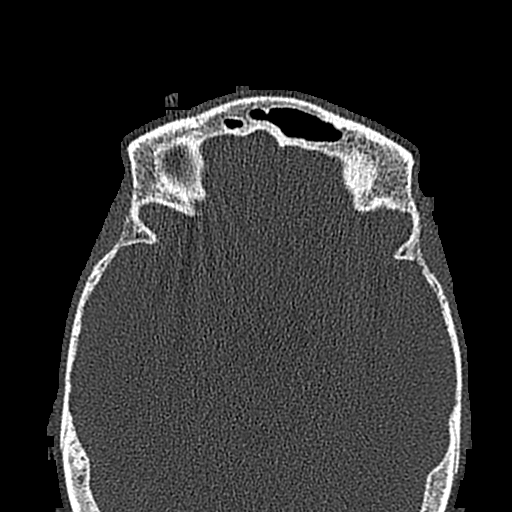
[im 107/160  bone]
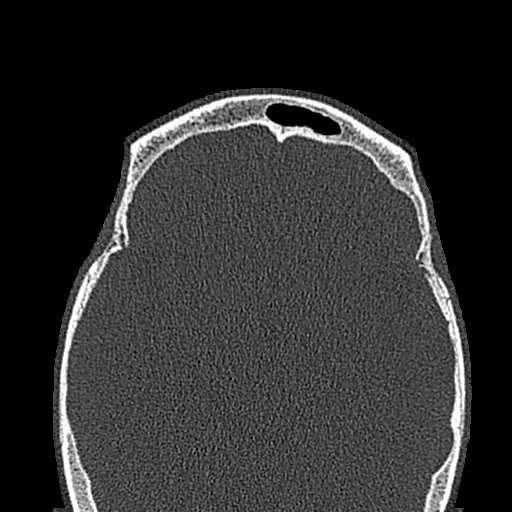
[im 124/160  brain]
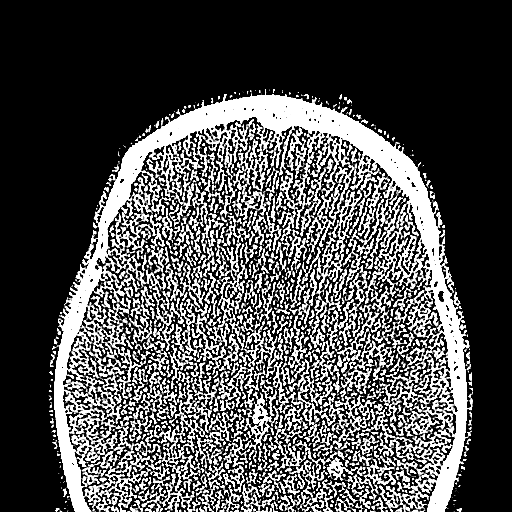
[im 124/160  bone]
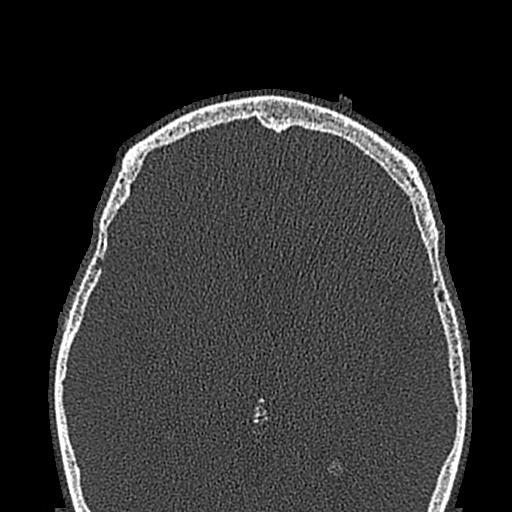
[im 133/160  bone]
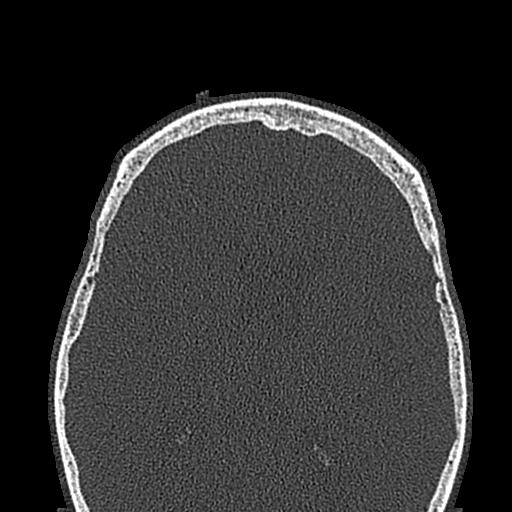
[im 151/160  bone]
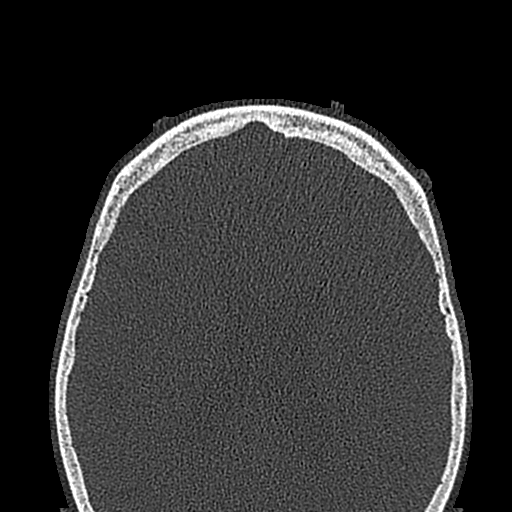

[Series 5: coronal · coronal · 0.19mm/px · 3 of 73 slices shown]
[im 19/73  bone]
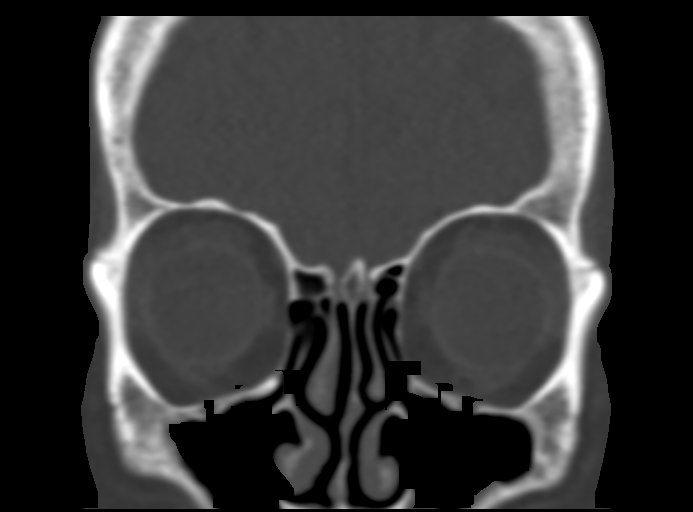
[im 37/73  bone]
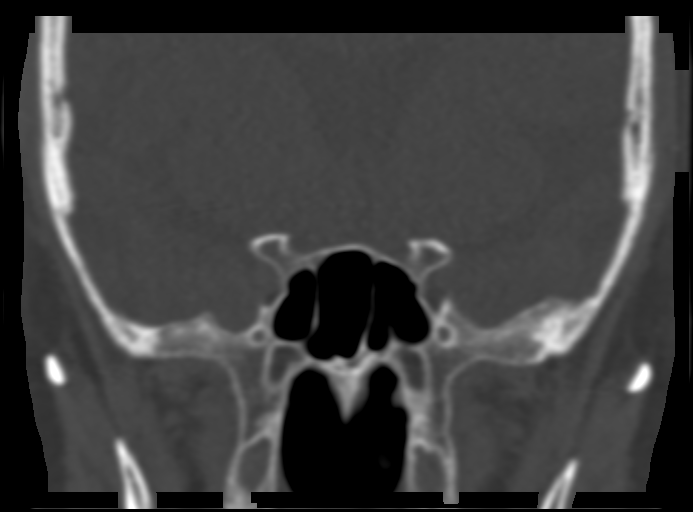
[im 55/73  bone]
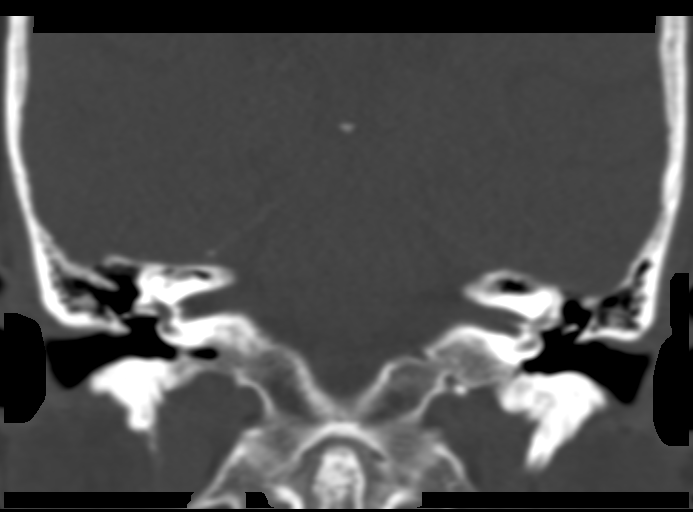

[14 of 40 positions shown; findings below may reference images not displayed]

FINDINGS: The extraocular muscles measure upwards to between 3 and 4 mm in maximum 
thickness and are therefore considered to be within normal limits in size. The 
optic nerve sheath complex appears normal. There is no evidence for proptosis. 
There is evidence of prior intraocular lens replacements. Bilateral calcified 
senile scleral plaques are present in the medial quadrants of both eyes. No 
retrobulbar edema or mass is present. The superior ophthalmic veins are normal 
in caliber. The lacrimal glands are normal. The optic canals appear normal. No 
obvious suprasellar pathology is identified. The bony orbital walls appear 
normal. Incidental small mucus retention cysts are in the LEFT maxillary sinus. 
No acute paranasal sinus disease is present. There is asymmetric enhancement in 
the posterolateral superior aspect of the LEFT cavernous sinus area potentially 
normal variation but recommend follow-up enhanced MR imaging directed towards 
the pituitary area for further evaluation to exclude the possibility of lesion 
such as meningioma.
IMPRESSION: 1. There is no CT evidence for thyroid ophthalmopathy. There is no proptosis. 
The extraocular muscles are normal in size. 
2. There is asymmetric enhancement in the region of the LEFT posterolateral 
superior cavernous sinus probably a normal variant in venous drainage but I 
would recommend follow-up enhanced MRI examination directed towards the 
pituitary area for further evaluation to exclude the less likely possibility of 
lesion such as meningioma. 
RADIATION DOSE REDUCTION: All CT scans are performed using radiation dose 
reduction techniques, when applicable.  Technical factors are evaluated and 
adjusted to ensure appropriate moderation of exposure.  Automated dose 
management technology is applied to adjust the radiation doses to minimize 
exposure while achieving diagnostic quality images.

## 2021-03-07 IMAGING — MR MRI BRAIN W/WO CONTRAST
8 series · 48 of 48 positions shown · IV contrast (gadavist)
Comparison: CT orbits February 02, 2021

MRI BRAIN W/WO CONTRAST,03/07/2021 [DATE]: 
CLINICAL INDICATION: Evaluate for potential left cavernous sinus mass
TECHNIQUE: Axial T1, Axial T2, Axial FLAIR, Diffusion weighted images, Sagittal 
T1, Enhanced Axial T1, and Enhanced coronal fat-suppressed T1 were obtained. 5 
cc's of gadavist was injected intravenously by hand. Macro i-STAT

[Series 103: patient aligned mpr · axial · 15.6mm · 0.98mm/px · z∈[-97,+152]mm · 14 of 49 slices shown]
[im 1/49]
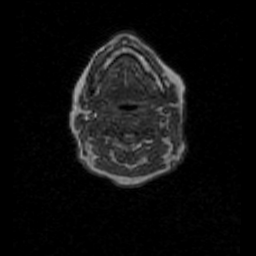
[im 4/49]
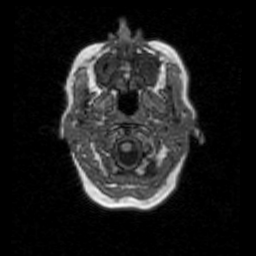
[im 8/49]
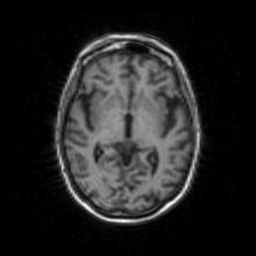
[im 12/49]
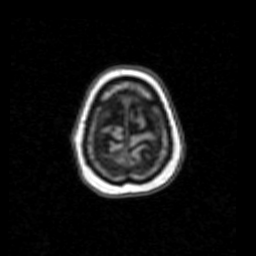
[im 15/49]
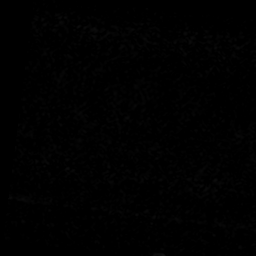
[im 19/49]
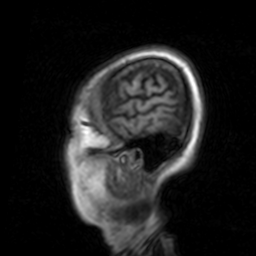
[im 23/49]
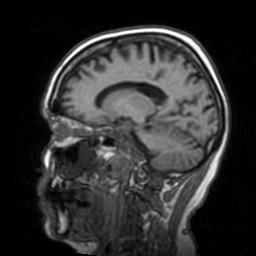
[im 26/49]
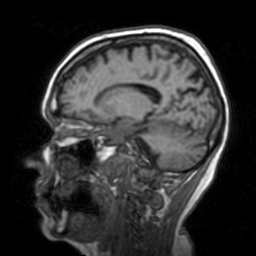
[im 30/49]
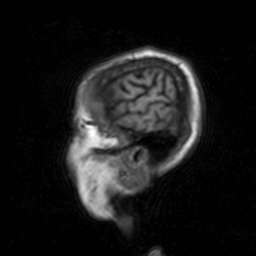
[im 34/49]
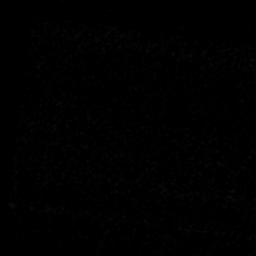
[im 37/49]
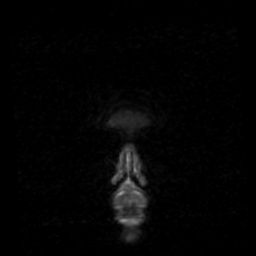
[im 41/49]
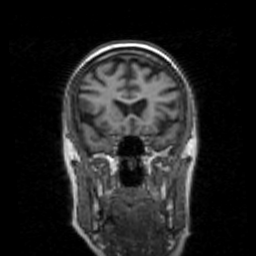
[im 45/49]
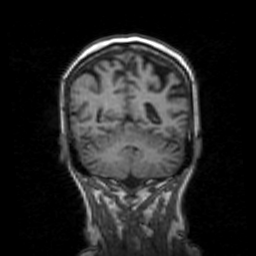
[im 49/49]
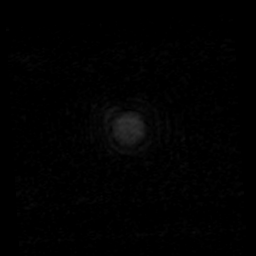

[Series 201: t1w_se · axial · 5.0mm · 0.90mm/px · z∈[-60,+83]mm · 7 of 25 slices shown]
[im 1/25]
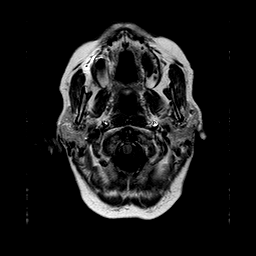
[im 5/25]
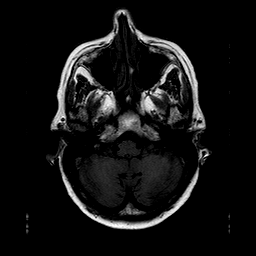
[im 9/25]
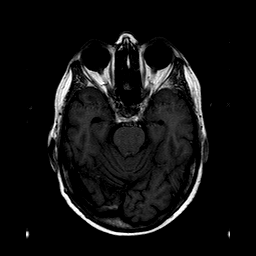
[im 13/25]
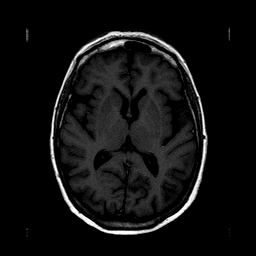
[im 17/25]
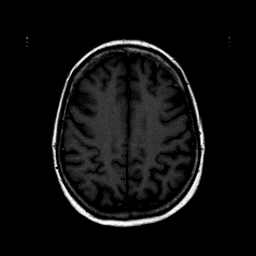
[im 21/25]
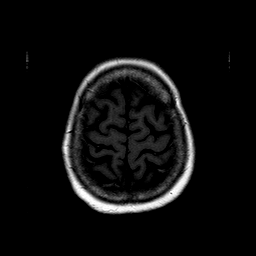
[im 25/25]
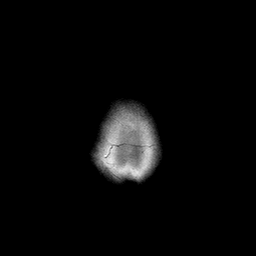

[Series 401: t1w_se_sag · sagittal · 2.0mm · 0.50mm/px · 4 of 15 slices shown]
[im 1/15]
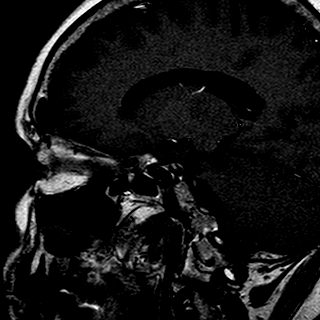
[im 5/15]
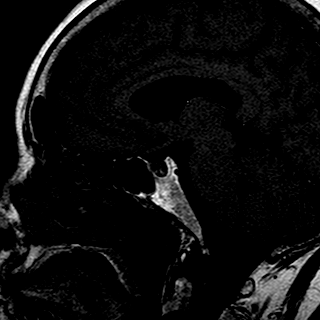
[im 10/15]
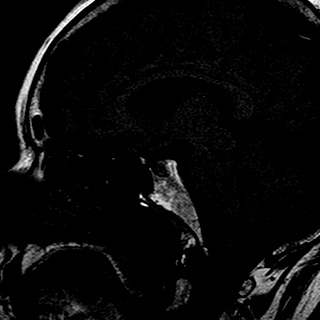
[im 15/15]
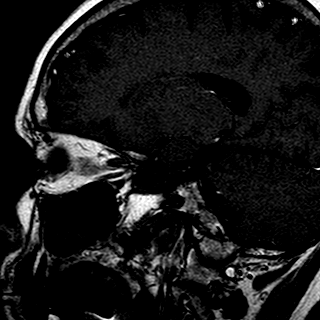

[Series 501: t1w_se_cor · coronal · 2.0mm · 0.50mm/px · 4 of 15 slices shown]
[im 1/15]
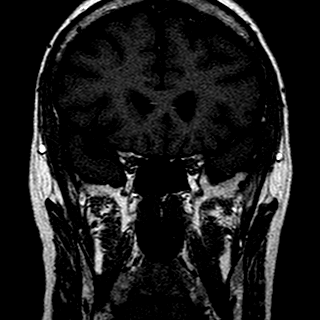
[im 5/15]
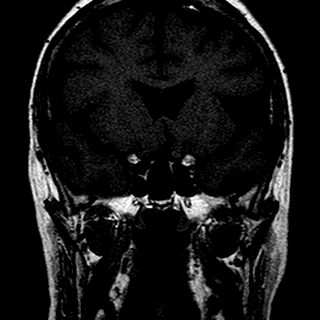
[im 10/15]
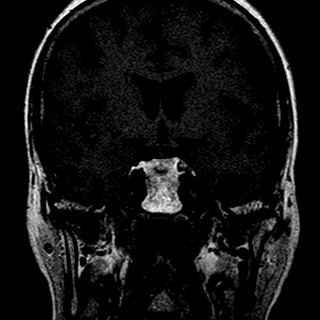
[im 15/15]
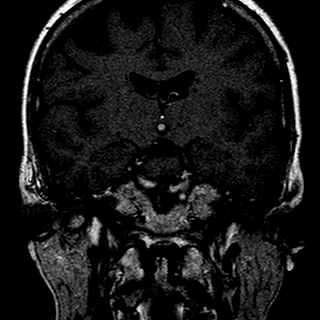

[Series 601: t2w_tse cor · coronal · 2.0mm · 0.47mm/px · 4 of 15 slices shown]
[im 1/15]
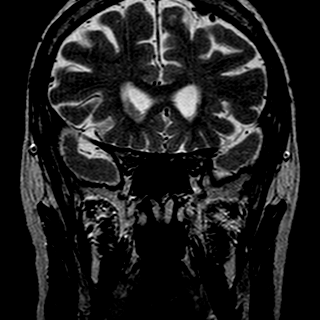
[im 5/15]
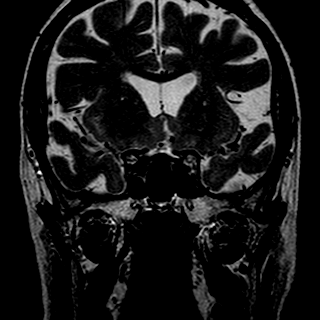
[im 10/15]
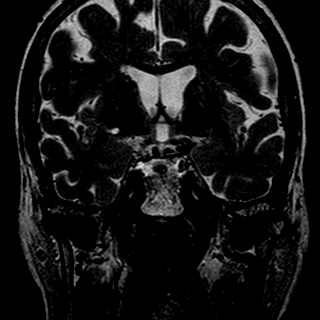
[im 15/15]
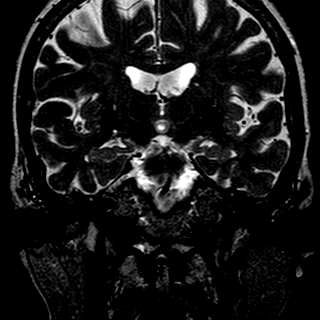

[Series 701: t1w_se_cor+gad · coronal · 2.0mm · 0.50mm/px · 4 of 15 slices shown]
[im 1/15]
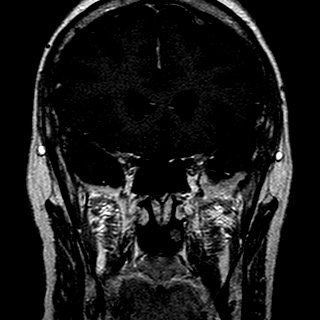
[im 5/15]
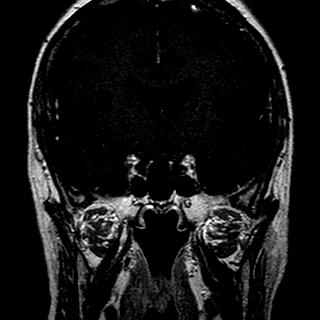
[im 10/15]
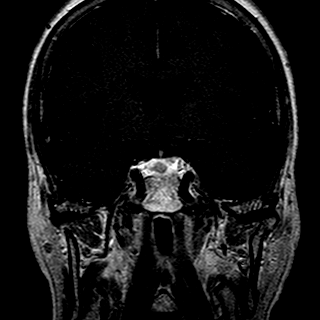
[im 15/15]
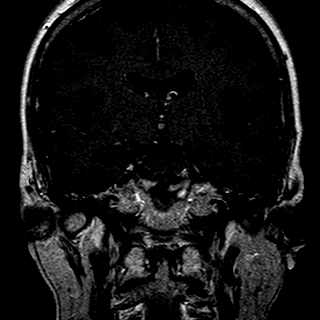

[Series 801: t1w_se_sag+gad · sagittal · 2.0mm · 0.50mm/px · 4 of 15 slices shown]
[im 1/15]
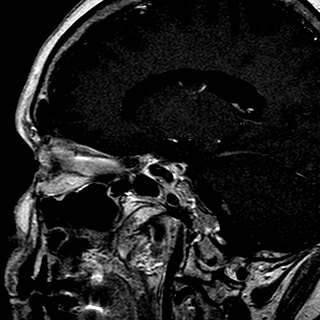
[im 5/15]
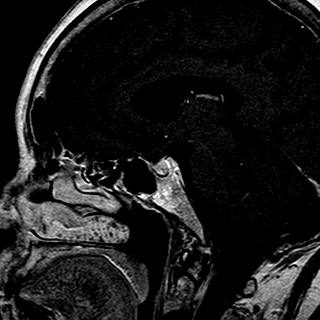
[im 10/15]
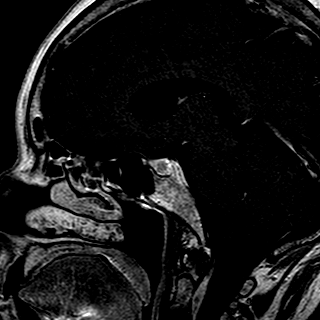
[im 15/15]
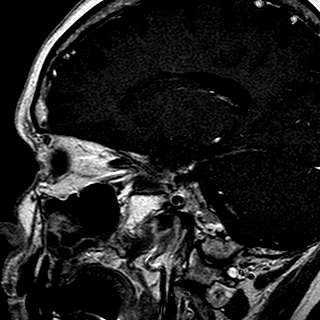

[Series 901: t1w_ax+gad · axial · 5.0mm · 0.90mm/px · z∈[-60,+83]mm · 7 of 25 slices shown]
[im 1/25]
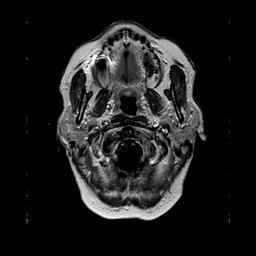
[im 5/25]
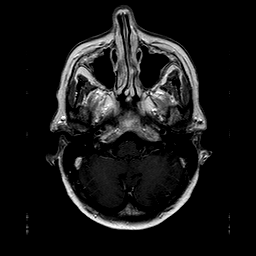
[im 9/25]
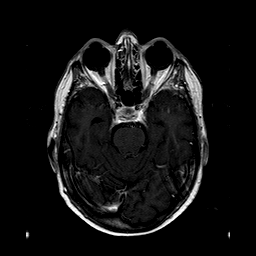
[im 13/25]
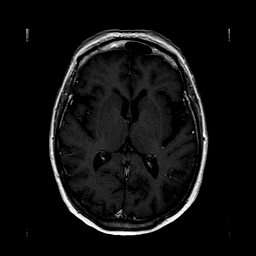
[im 17/25]
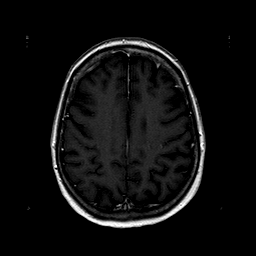
[im 21/25]
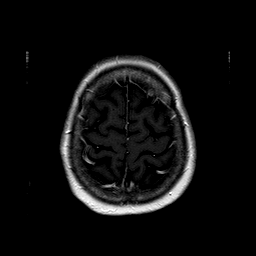
[im 25/25]
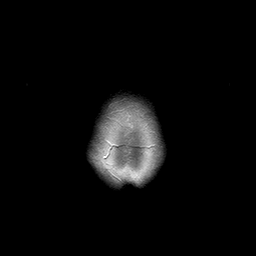

[48 of 48 positions shown; findings below may reference images not displayed]

FINDINGS: Lumbar alignment enhanced coronal image 24, and fat-suppressed 
enhanced coronal image 9 there is concern for a 5 mm round mass in the left 
posterosuperior cavernous sinus corresponding to the finding on the recent 
orbital CT. This is indeterminant but could represent a small meningioma. There 
does appear to be slight deformity on the adjacent adenohypophysis. The 
infundibulum remains midline. No intrasellar lesion. The chiasm is unremarkable, 
without deformity. Right cavernous sinus is normal. Cavernous carotid arteries 
are open. Meckel's cave cisterns are unremarkable. The cavernous and 
supraclinoid internal carotid arteries are open. 
There is no intracranial mass. No hydrocephalus. There are mild chronic white 
matter microangiopathic changes. Diffusion images were not obtained. There are 
bilateral maxillary retention cysts. No orbital mass evident.
IMPRESSION: Suspect 5 mm enhancing mass within the posterosuperior left cavernous sinus, 
most likely meningioma. Further follow-up evaluation with dedicated pituitary 
MRI advised.

## 2021-04-13 IMAGING — MR MRI PITUITARY W/WO CONTRAST
4 of 8 series · 23 of 48 positions shown · IV contrast (gadavist)
Comparison: Enhanced brain MRI March 07, 2021

MRI PITUITARY W/WO CONTRAST, 04/13/2021 [DATE]: 
CLINICAL INDICATION: Follow-up brain abnormality
TECHNIQUE: Thin section sagittal and coronal T1, coronal T2 images through the 
sella were acquired prior to contrast. Axial FLAIR images of the entire brain 
also obtained. After the intravenous administration of 7.5 cc's of Gadavist 
injected intravenously by hand, dynamic enhanced coronal images through the 
sella were obtained as well as static thin section sagittal and coronal T1 
images through the sella. Subsequently, enhanced axial fat suppressed T1 
sequences through the entire brain were acquired. The patient's eGFR was 
calculated to be 66 using the i-STAT device.

[Series 101: survey · axial · 10.0mm · 0.94mm/px · 1 of 5 slices shown]
[im 1/5]
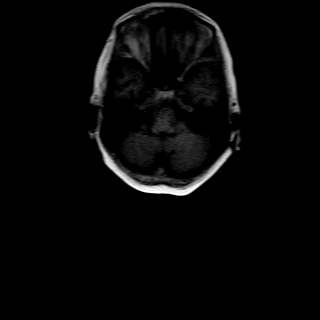

[Series 201: flair_ax · axial · 5.0mm · 0.49mm/px · z∈[-78,+69]mm · 9 of 27 slices shown]
[im 1/27]
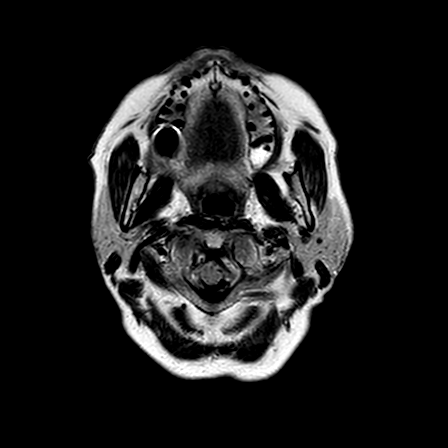
[im 4/27]
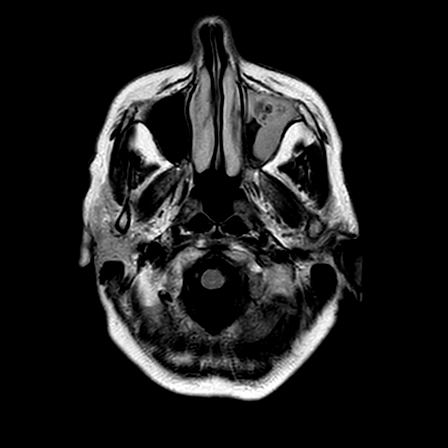
[im 7/27]
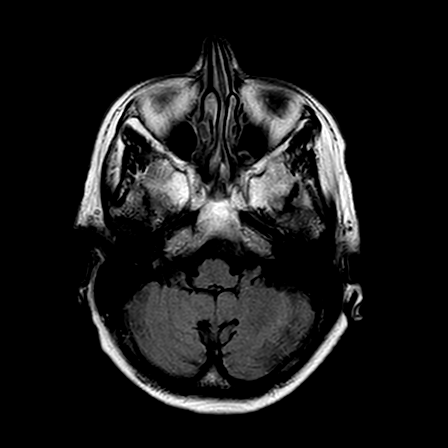
[im 10/27]
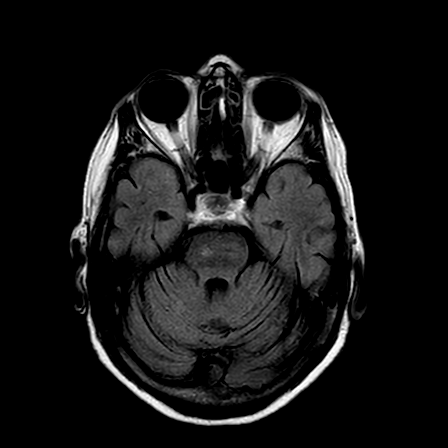
[im 14/27]
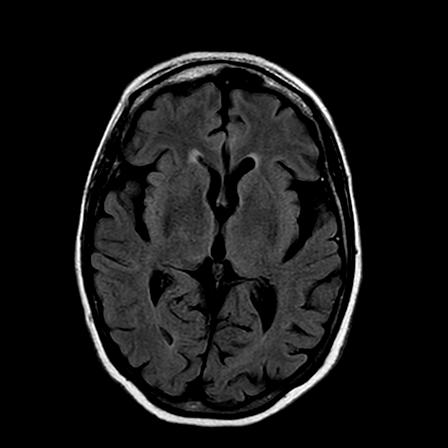
[im 17/27]
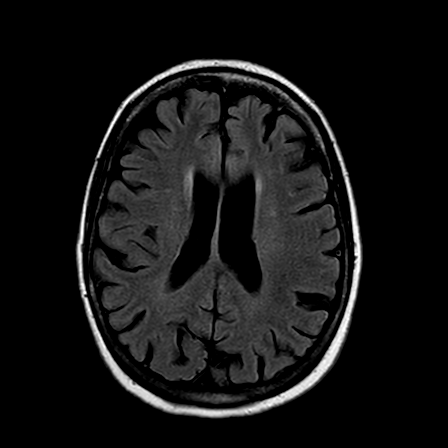
[im 20/27]
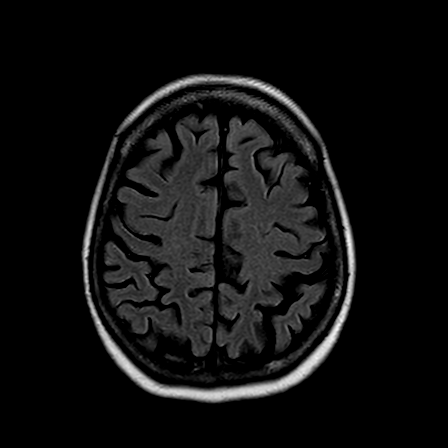
[im 23/27]
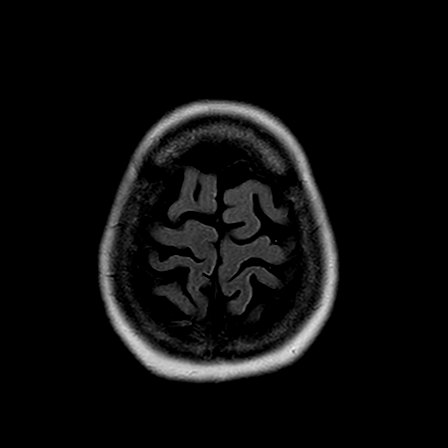
[im 27/27]
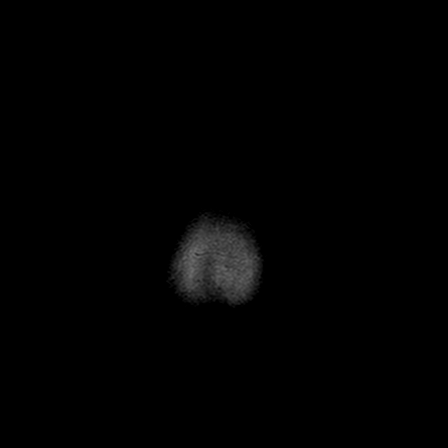

[Series 301: t2_cor · coronal · 4.0mm · 0.47mm/px · 4 of 36 slices shown]
[im 1/36]
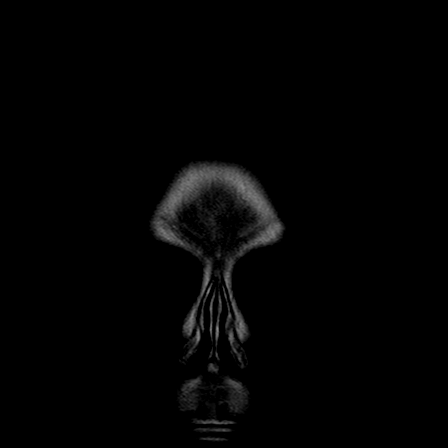
[im 4/36]
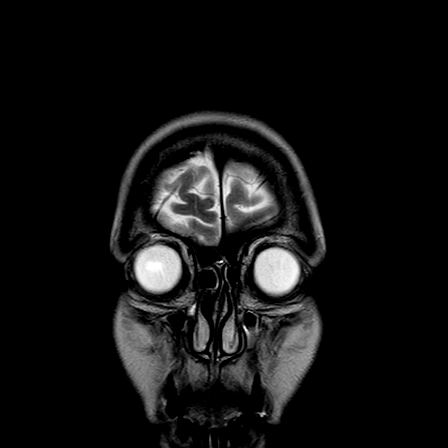
[im 18/36]
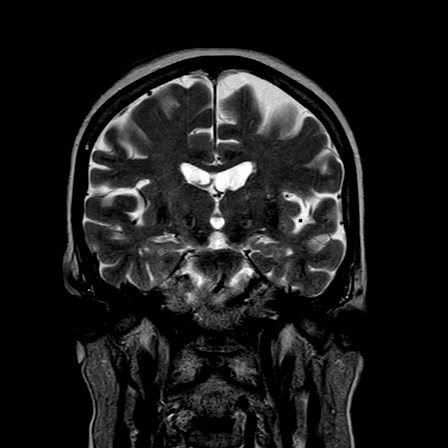
[im 32/36]
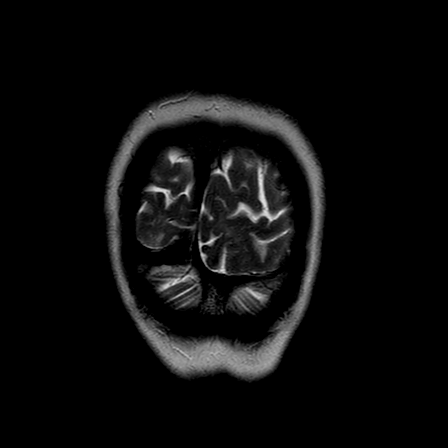

[Series 1001: T1 post-contrast · axial · 5.0mm · 0.43mm/px · z∈[-78,+69]mm · 9 of 27 slices shown]
[im 1/27]
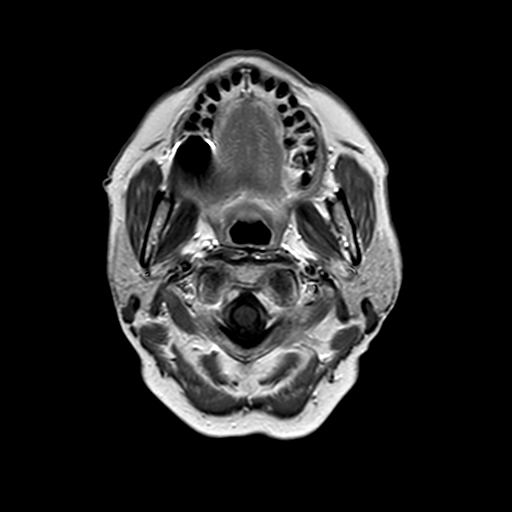
[im 4/27]
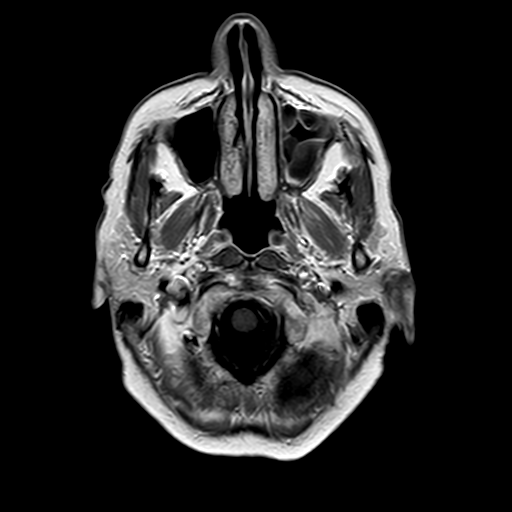
[im 7/27]
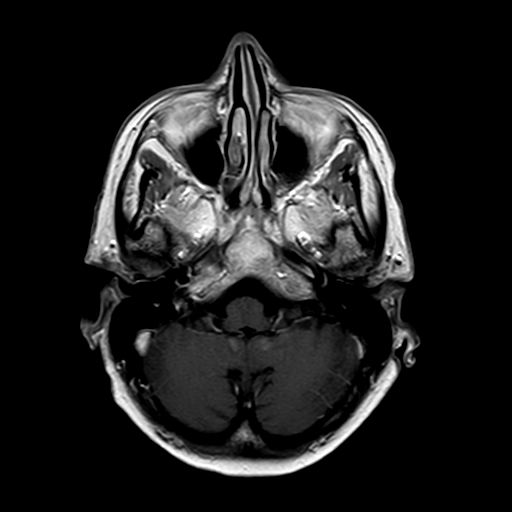
[im 10/27]
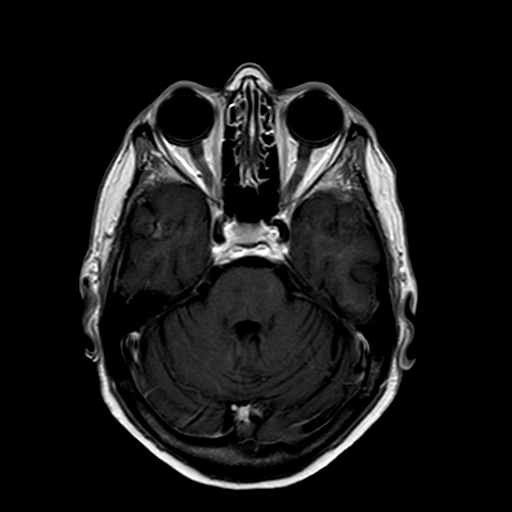
[im 14/27]
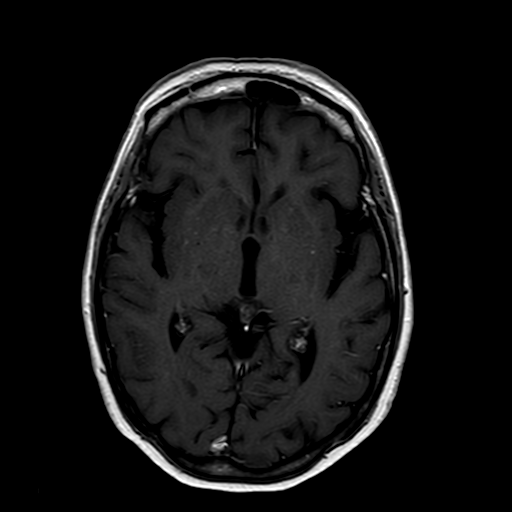
[im 17/27]
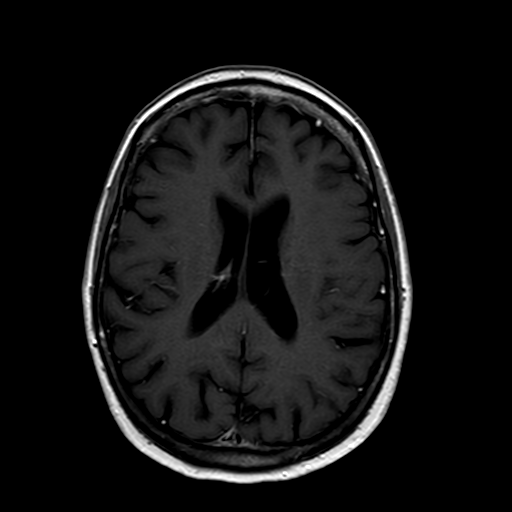
[im 20/27]
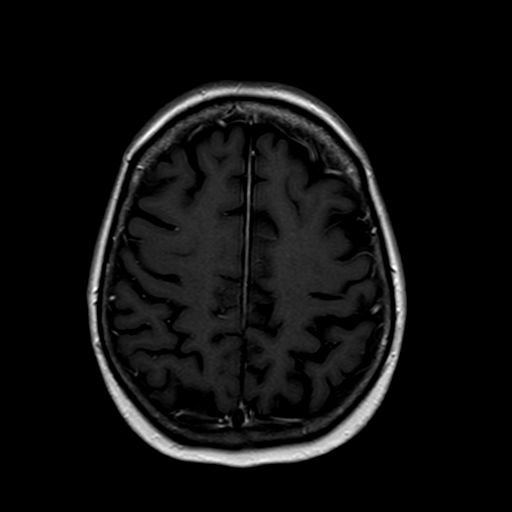
[im 23/27]
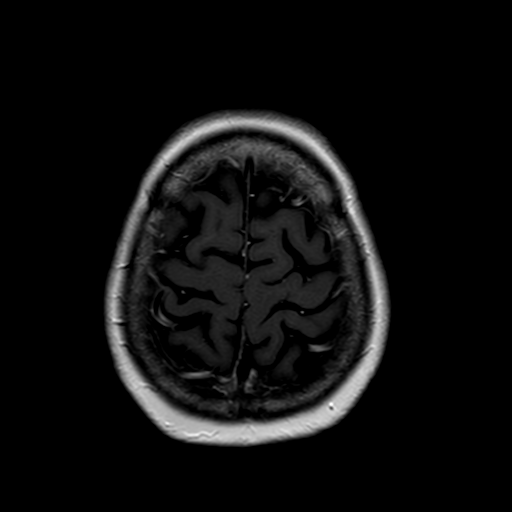
[im 27/27]
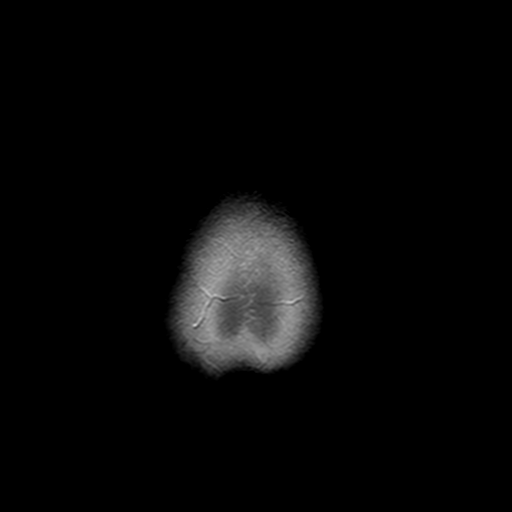

[23 of 48 positions shown; findings below may reference images not displayed]

FINDINGS: There appears to be an approximately 5 x 4 cm mildly enhancing lesion 
in the left cavernous sinus, best seen on enhanced coronal images 8 and 9, and 
sagittal images 2-4. The lesion measures 6 x 5 mm on enhanced sagittal image 4. 
This is isointense on the coronal T2 image 8. This abuts the adenohypophysis 
with slight deformity. This is adjacent to the cavernous carotid artery. 
No intrasellar lesion. The infundibulum is midline. Chiasm is unremarkable. 
Right cavernous sinus appears normal. 
No other intracranial mass. No other pathologic enhancement.
IMPRESSION: Approximately 6 x 5 mm enhancing lesion in the left cavernous sinus. This abuts 
the left margin of the adenohypophysis with slight deformity. This is 
indeterminant, possibly a small meningioma or schwannoma. Further surveillance 
follow-up with enhanced MR of the pituitary recommended.

## 2021-04-19 IMAGING — MR MRI CERVICAL SPINE WITHOUT CONTRAST
6 series · 44 of 48 positions shown · IV contrast (gadolinium)
Comparison: Bone scan of 05/02/2015. Cervical spine radiographs of 04/02/2013.

MRI CERVICAL SPINE WITHOUT CONTRAST, 04/19/2021 [DATE]: 
CLINICAL INDICATION: Neck and bilateral shoulder pain for 4 months without acute 
injury.
TECHNIQUE: Multiplanar, multiecho position MR images of the cervical spine were 
performed without intravenous gadolinium enhancement.

[Series 101: survey · axial · 10.0mm · 0.94mm/px · z∈[-15,+150]mm · 4 of 9 slices shown]
[im 1/9]
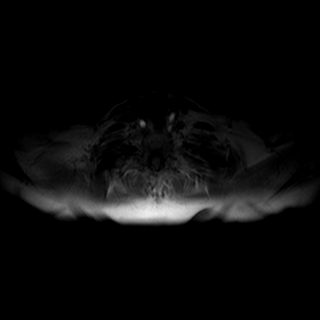
[im 3/9]
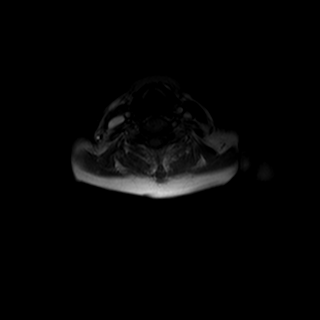
[im 6/9]
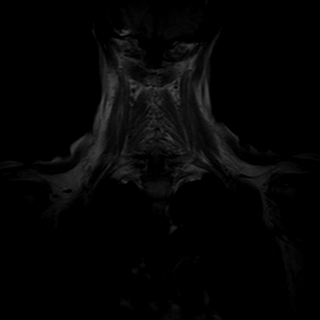
[im 9/9]
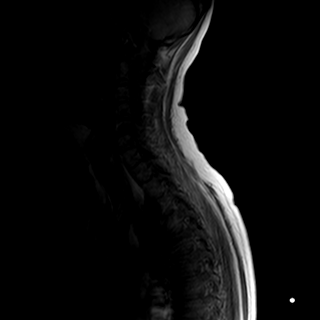

[Series 301: t1w_tse sag · sagittal · 3.0mm · 0.51mm/px · 6 of 15 slices shown]
[im 1/15]
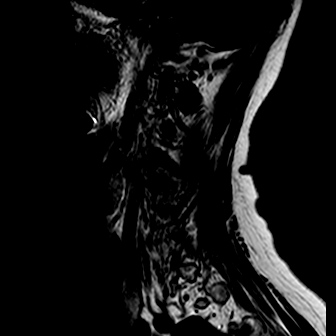
[im 3/15]
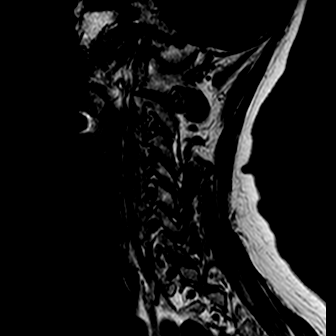
[im 6/15]
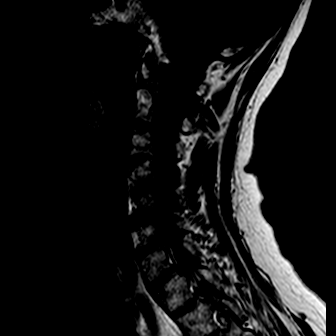
[im 9/15]
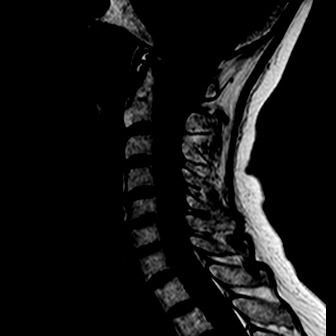
[im 12/15]
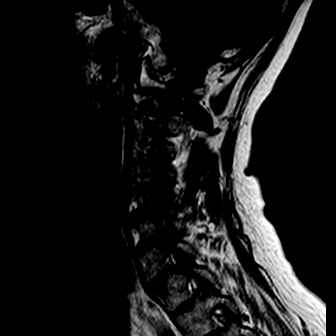
[im 15/15]
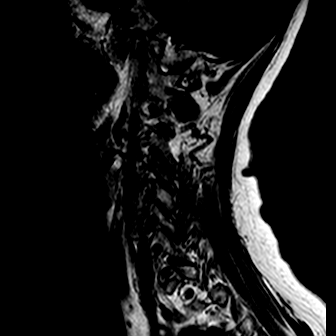

[Series 401: t2w_tse sag · sagittal · 3.0mm · 0.51mm/px · 6 of 15 slices shown]
[im 1/15]
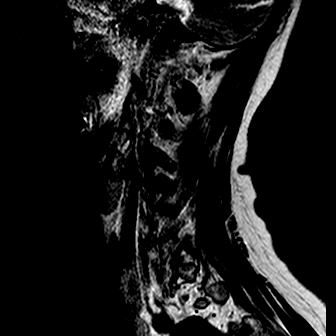
[im 3/15]
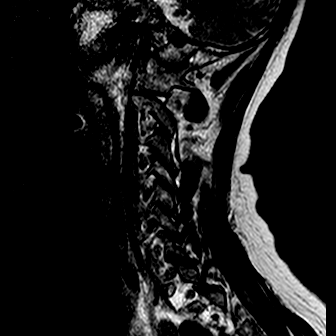
[im 6/15]
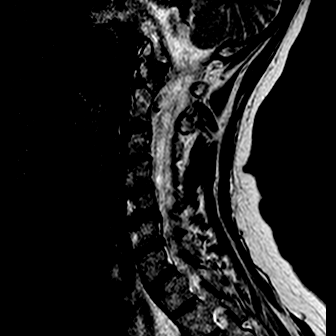
[im 9/15]
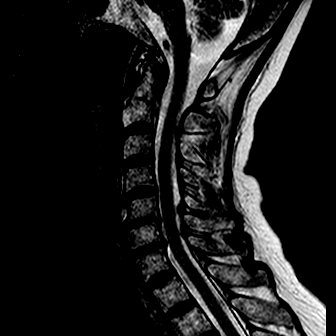
[im 12/15]
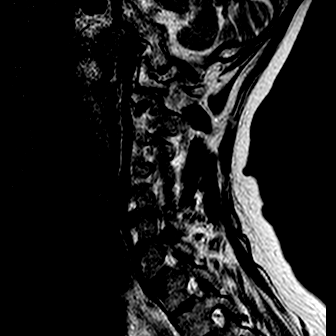
[im 15/15]
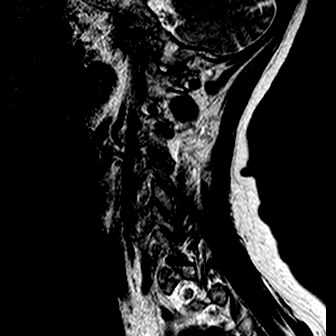

[Series 501: stir_longte sag · sagittal · 3.0mm · 0.62mm/px · 6 of 15 slices shown]
[im 1/15]
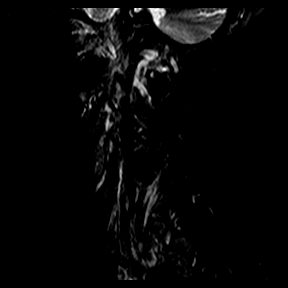
[im 3/15]
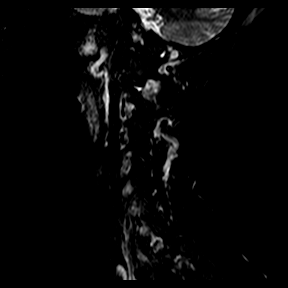
[im 6/15]
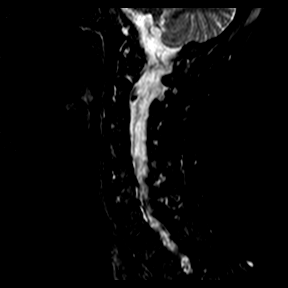
[im 9/15]
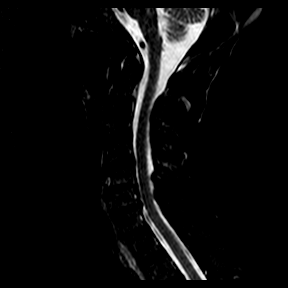
[im 12/15]
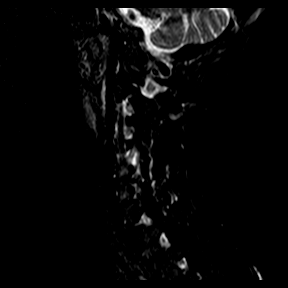
[im 15/15]
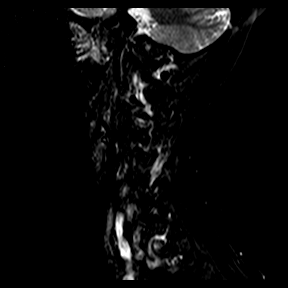

[Series 601: t2w_tse ax · axial · 3.0mm · 0.44mm/px · z∈[-21,+74]mm · 13 of 33 slices shown]
[im 1/33]
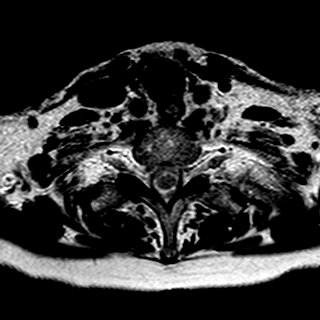
[im 3/33]
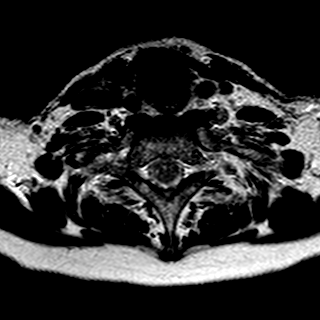
[im 6/33]
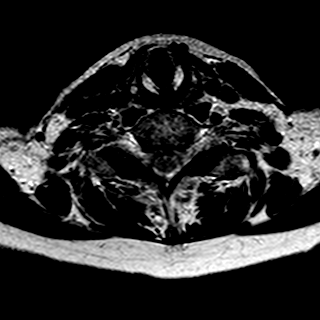
[im 9/33]
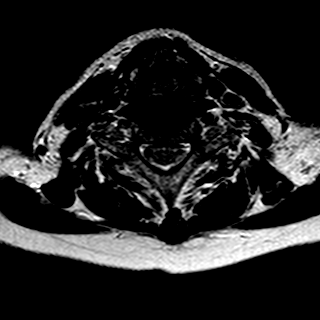
[im 11/33]
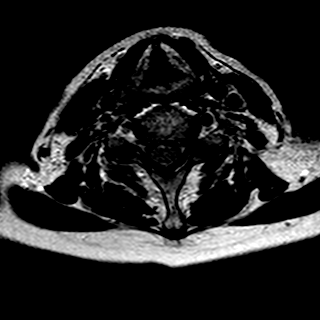
[im 14/33]
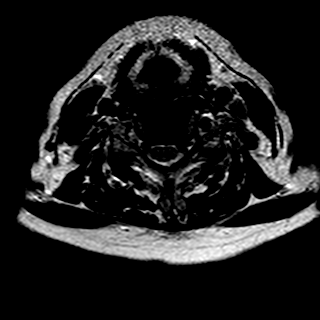
[im 17/33]
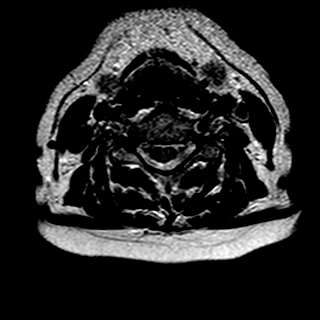
[im 19/33]
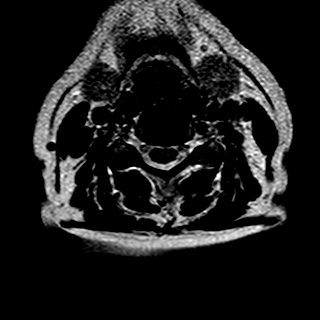
[im 22/33]
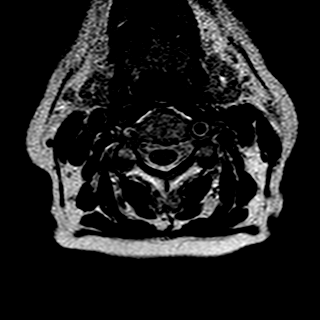
[im 25/33]
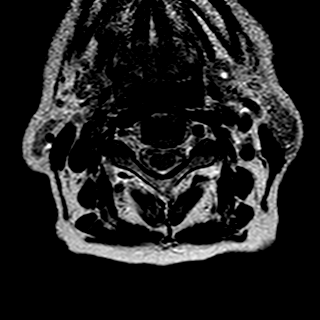
[im 27/33]
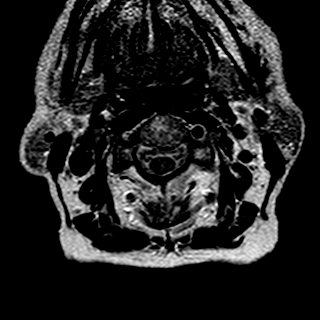
[im 30/33]
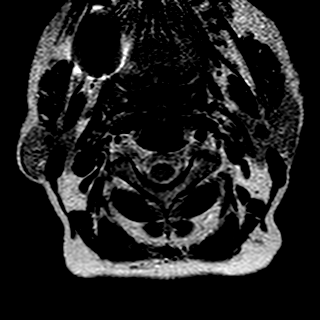
[im 33/33]
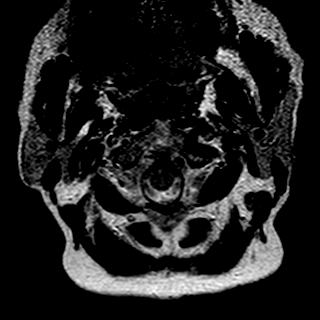

[Series 801: t2w_ffe_(id) · axial · 3.0mm · 0.44mm/px · z∈[-27,+16]mm · 9 of 31 slices shown]
[im 1/31]
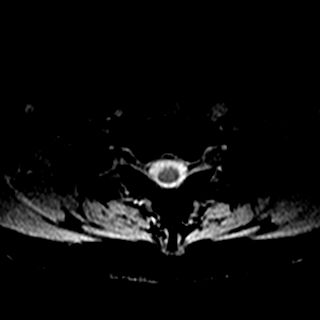
[im 3/31]
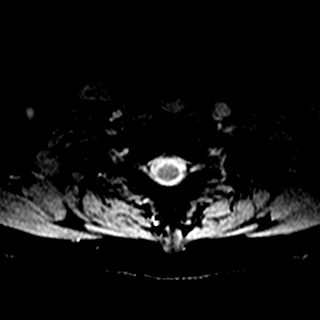
[im 6/31]
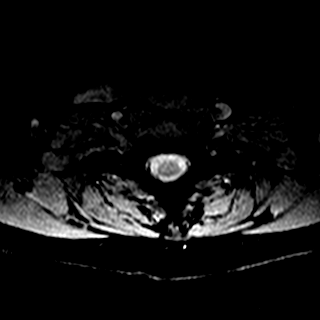
[im 11/31]
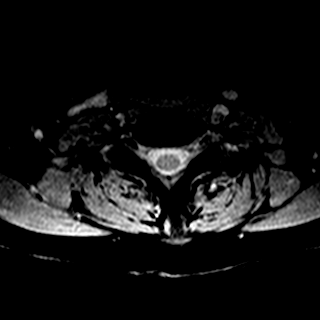
[im 13/31]
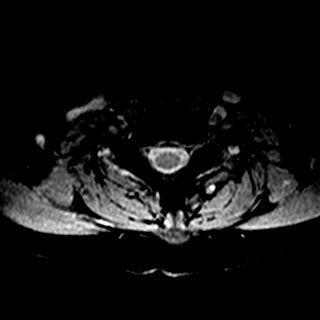
[im 18/31]
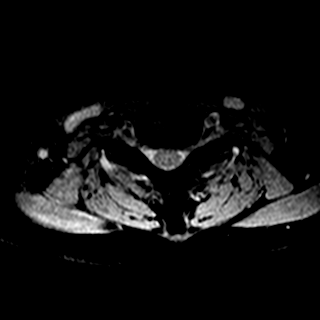
[im 21/31]
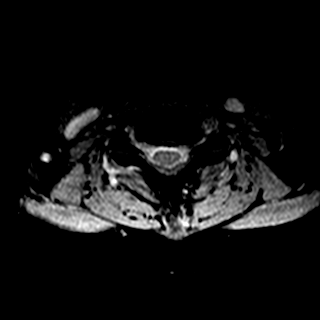
[im 26/31]
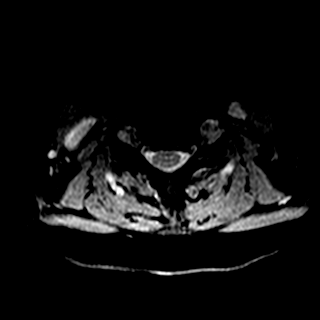
[im 31/31]
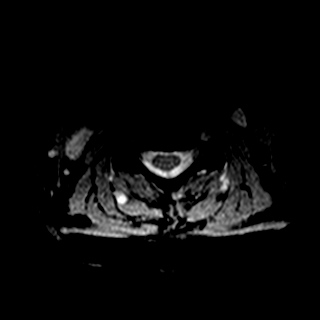

[44 of 48 positions shown; findings below may reference images not displayed]

FINDINGS: The vertebral bodies are normal in height and alignment. No vertebral 
body fracture. No spondylolisthesis. Normal bone marrow signal intensity. No 
signal abnormality or mass within the included portion of the spinal cord or 
spinal canal. The cerebellar tonsils are well located. The foramen magnum is 
negative. Included portions of the intracranial contents are negative. Posterior 
paraspinal soft tissues are negative. 
C2-C3: Moderate disc desiccation without disc height loss. Shallow broad-based 
disc protrusion. Mild facet hypertrophy. No spinal canal or neural foraminal 
stenoses. 
C3-C4: Moderate disc desiccation without disc height loss. No disc herniation. 
Mild facet hypertrophy. No spinal canal or neural foraminal stenoses. 
C4-C5: Moderate disc desiccation without disc height loss. No disc herniation. 
Mild facet hypertrophy. No spinal canal or neural foraminal stenoses. 
C5-C6: Moderate disc desiccation and dorsal disc height loss. Mild dorsal disc 
osteophyte ridging. There is uncovertebral and facet hypertrophy slightly 
greater on the left with severe left and moderate to severe right neural 
foraminal stenoses. There is potential for impingement of the right greater than 
left C6 nerve roots. Mild spinal canal stenosis with the thecal sac narrowed to 
8 mm AP. 
C6-C7: Moderate disc desiccation and mild dorsal disc height loss. Mild 
broad-based disc protrusion. There is uncovertebral and facet hypertrophy 
greater on the left with severe left neural foraminal stenosis. There is 
potential for impingement of the left C7 nerve root. Mild spinal canal stenosis 
with the thecal sac narrowed to 8 mm AP. 
C7-T1: Moderate disc desiccation without disc height loss. Mild facet 
hypertrophy. No spinal canal or neural foraminal stenoses.
IMPRESSION: 1.  Moderately advanced spondylotic changes cervical spine. 
2.  Multilevel neural foraminal stenoses with potential for neural impingement 
of the right greater than left C6 nerve roots and left C7 nerve root. 
3.  Mild spinal canal stenosis at C5-6 and C6-C7.

## 2021-06-16 ENCOUNTER — Other Ambulatory Visit

## 2021-06-19 ENCOUNTER — Encounter (HOSPITAL_BASED_OUTPATIENT_CLINIC_OR_DEPARTMENT_OTHER): Admitting: Neurological Surgery

## 2021-06-19 ENCOUNTER — Other Ambulatory Visit

## 2021-06-19 ENCOUNTER — Ambulatory Visit: Admit: 2021-06-19 | Discharge: 2021-06-19 | Payer: MEDICARE | Attending: Neurological Surgery

## 2021-06-19 VITALS — BP 150/76 | HR 87 | Ht 63.6 in | Wt 115.0 lb

## 2021-06-19 DIAGNOSIS — R9089 Other abnormal findings on diagnostic imaging of central nervous system: Secondary | ICD-10-CM

## 2021-06-19 NOTE — Progress Notes (Signed)
 Neurosurgery Office Visit     Patient: Molly Jordan  DOB: 1946-11-21  MRN#: [id number]  Primary Care MD: Per Patient No Pcp (Inactive)  Referring MD: No ref. provider found    Chief Complaint: Small mass found between the pituitary gland and the medial wall of the left cavernous sinus     History of Present Illness:   Ms. Adderly was well until several months ago when she experienced several spontaneous episodes of bleeding into her sclera.. She says it was on the same place in her right eye every time, medial to the iris. She saw her ophthalmologist who apparently suspected thyroid issues, so she went to see an endocrinologist. She has known hypothyroidism on hormone replacement but has had sporadic high thyroid hormone levels (she changed her dose of levothyroxine from daily to 88 in Oct. 2020, further reduced to 75 mcg in 2021, and finally in Nov. 2021 changed to 75 6 days a week and 150 one day per week). She was checked for thyroiditis which was negative. She was referred to neuro-opthalmology, where a CT head showed a possible left cavernous sinus lesion. An MRI was performed which confirmed this finding.     She reports that she is now having trouble with her vision. Lines are described as wavy when she reads, having to concentrate and read word by word. 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 She feels she sees shadows on her peripheral vision bilaterally. Her left eye has been better historically and she feels it has worsened over the last few months, more blurry and less clear now. She had double vision in 2020, and she was measured for new lenses which helped but she had to change them several times. The diplopia would only happen with moving objects. She still has it occasionally but it is significantly improved.     She notes dizziness. She feels light headed, happening randomly and not  specifically with movement. The episodes last a few minutes at a time and self resolve. Happens a few times a week. She denies room spinning with this.     Headaches: worsened headaches over last three months or so, goes with neck pain into shoulders. The HAs are on the back of the head, described as tight and pulling pain.    Nausea/vomiting: none recently.  Palpitations: none.  Fatigue/low energy: no.   Hot/cold intolerance: feels warmer than usual recently  Change in libido: no    I reviewed her imaging studies. The MRI shows a 5 mm nodule of enhancing tissue between the left edge of the pituitary gland and the medial wall of the left cavernous sinus.  The enhancing nodule touches the left edge of the pituitary gland.    Medical/Surgical History:  History reviewed. No pertinent past medical history.   Cold urticaria  Cataracts  Leg pain, worse with walking  GERD  HLD  Macular pucker    Past Surgical History:   Procedure Laterality Date   ? BREAST LUMPECTOMY Bilateral    ? CATARACT EXTRACTION Bilateral    ? HYSTERECTOMY     ? PARS PLANA VITRECTOMY W/ REPAIR OF MACULAR HOLE Right    bilateral cataract surgery in 2018    There is no problem list on file for this patient.    Medications:    Current Outpatient Medications:   ?  aspirin 81 mg EC tablet, in the morning.,  Disp: , Rfl:   ?  folic acid (Folvite) 1 mg tablet, in the morning., Disp: , Rfl:   ?  levothyroxine (Synthroid, Levoxyl) 75 mcg tablet, 1 tablet 6 days a week and 2 tablets on the 7th day., Disp: , Rfl:   ?  omeprazole OTC (PriLOSEC OTC) 20 mg EC tablet, twice a day., Disp: , Rfl:   ?  prednisoLONE acetate (Pred-Forte) 1 % ophthalmic suspension, 1 drop each eye twice daily, Disp: , Rfl:      Problems and Medications Reviewed    Allergies:  Bacitracin     Family History:  No family history on file.     Social History:   reports that she has quit smoking. She has never used smokeless tobacco. She reports current alcohol use of about 2.0 standard drinks  of alcohol per week. She reports that she does not use drugs.      Review of Systems:     All other systems are reviewed and are otherwise negative except as noted in the HPI    Physical Exam:  Vitals:    06/19/21 1443   BP: (!) 150/76   Pulse: 87     Height:  1.615 m   Weight: 52.2 kg   BMI:      Body mass index is 19.99 kg/m?Marland Kitchen     Mental status: Awake, alert, and oriented to person, place, and time.  Speech, language, affect and cognition are normal.    Cranial Nerves:  CN II: The pupils are round, equal, and reactive to light. The optic discs are sharp. There is no papilledema or optic atrophy.  Visual Fields are full. VA is 20/20 bilaterally  CN III,IV,VI: The extraocular movements are normal. There is no abnormal nystagmus.   CN V: Facial sensation is intact. There is no atrophy of the muscles of mastication. The jaw opens in the midline.   CN VII: Facial nerve function is normal, Grade 1/6 on the House Brackmann Scale.   CN VIII: Hearing is intact to finger rub bilaterally.   CN IX, X: The uvula and palate elevate in the midline.  CN XI: The sternocleidomastoid and trapezius muscle strength are normal. There is no SCM or trapezius atrophy.   CN XII: The tongue is normal without atrophy. The tongue protrudes in the midline  Motor Exam: Strength is 5/5 in the deltoids, biceps, brachioradialis, triceps, intrinsic hand muscles, iliopsoas, quadriceps and anterior tibialis  Sensory Exam: Sensation is intact to light touch throughout bilateral upper and lower extremities.   Gait: Normal, coordinated, and not wide based. Tandam gait (heel to toe) is intact  Cerebellar: No dysmetria. Finger to nose intact bilaterally.   Reflexes: The biceps 2+ and patellar reflexes 1+ bilaterally.    Assessment/Plan:    In summary, Sadeel Fiddler is a 75 y.o. woman who was found to have an incidental 5 mm enhancing nodule in her skull base between the left edge of the pituitary gland and the medial wall of the left cavernous sinus.   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  It is too medial for a cavernous sinus schwannoma and looks too round for a meningioma.      I discussed the options with Ms. Kearn. I am not convinced that surgery is needed at this time for this incidental finding.   I favor watchful waiting with a f/u MRI 6 months after her last MRI.  We will make arrangements for this in November 2022.  Creta Levin MD  Chair and Professor, Department of Neurosurgery  [facility] and [facility]  Phone: [phone]          Visit Evaluation and Documentation assisted by M. Andree Elk, MD

## 2021-09-29 IMAGING — MR MRI LUMBAR SPINE WITHOUT CONTRAST
4 of 7 series · 15 of 48 positions shown · IV contrast (gadolinium)
Comparison: 

________________________________________________________________________________________________ 
******** ADDENDUM #1 ********/n 
No prior comparison. [DATE] Tesla magnet. 
MRI LUMBAR SPINE WITHOUT CONTRAST, 09/29/2021 [DATE]: 
CLINICAL INDICATION: Intervertebral disc disorder with radiculopathy, other 
spondylosis, patient describes back pain with extension into both legs and right 
groin
TECHNIQUE: Multiplanar, multiecho position MR images of the lumbar spine were 
performed without intravenous gadolinium enhancement. Patient was scanned on a 
magnet.

[Series 101: survey · axial · 10.0mm · 1.39mm/px · z∈[-15,+199]mm · 2 of 9 slices shown]
[im 1/9]
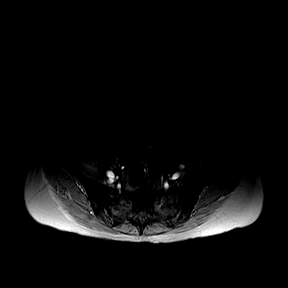
[im 9/9]
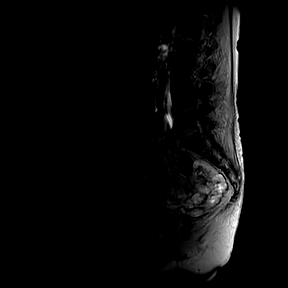

[Series 201: t2w_cor-surv · coronal · 6.0mm · 0.50mm/px · 2 of 5 slices shown]
[im 1/5]
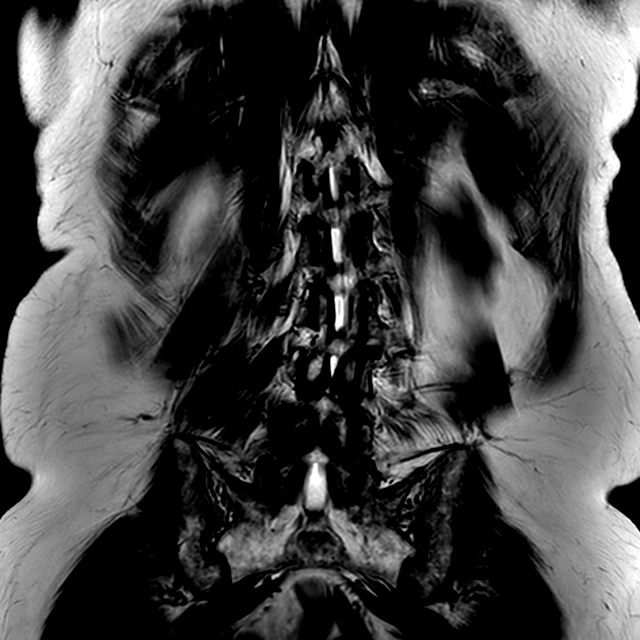
[im 5/5]
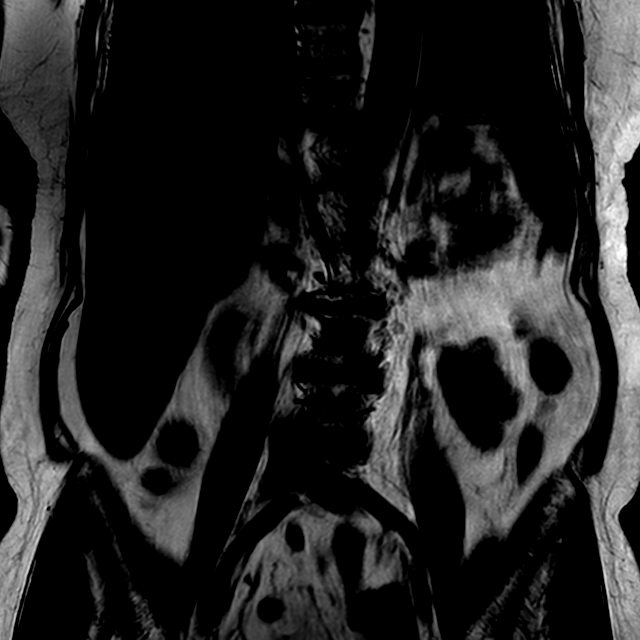

[Series 301: t2w_tse sag · sagittal · 4.0mm · 0.32mm/px · 3 of 17 slices shown]
[im 4/17]
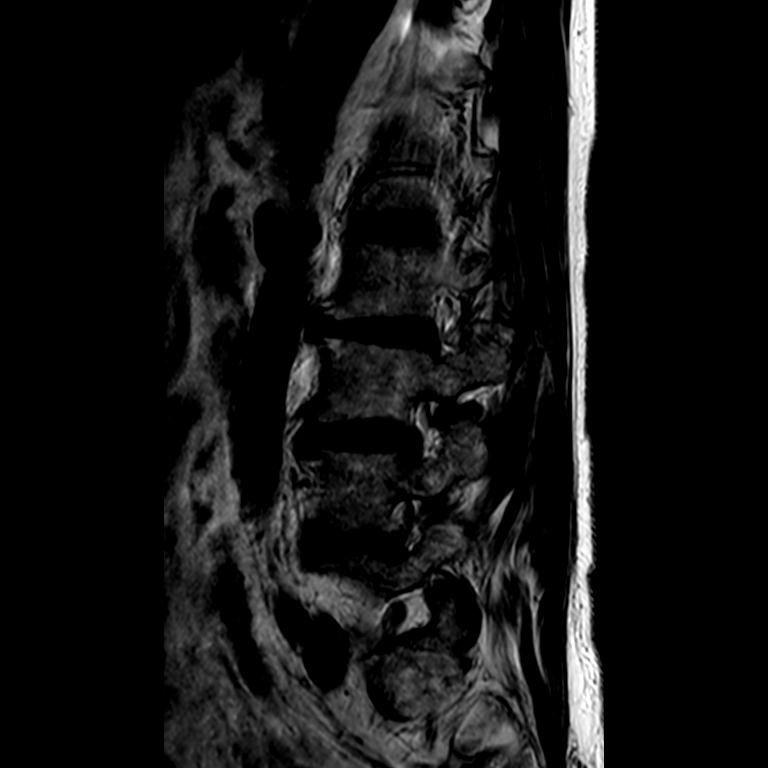
[im 10/17]
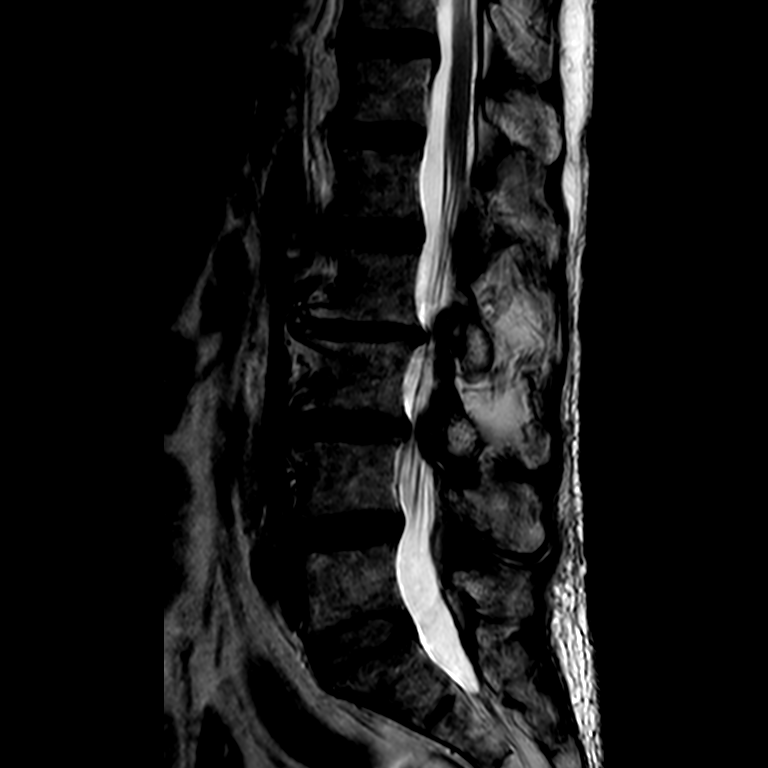
[im 17/17]
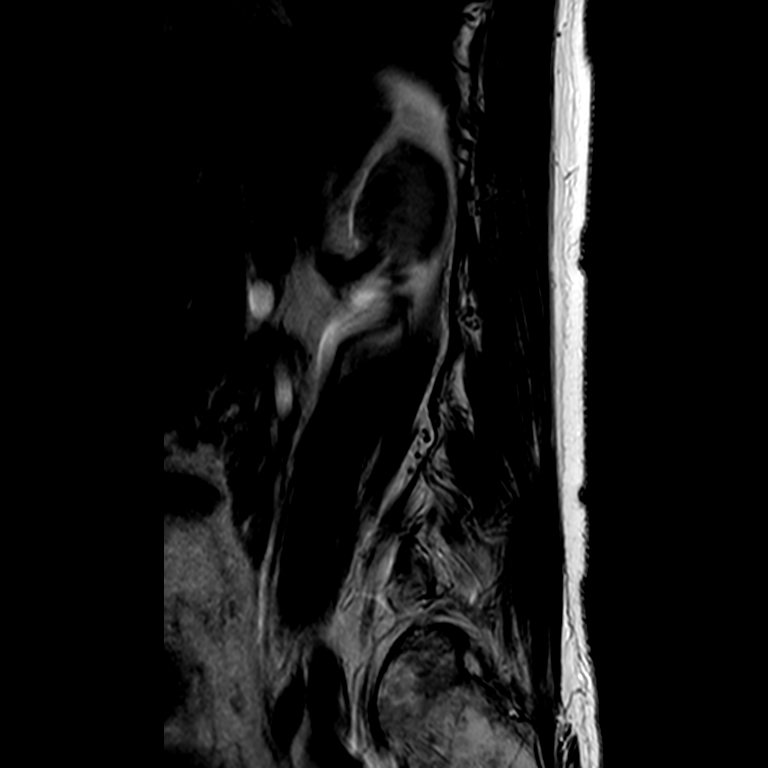

[Series 701: T1 · axial · 4.0mm · 0.35mm/px · z∈[-22,+138]mm · 8 of 35 slices shown]
[im 1/35]
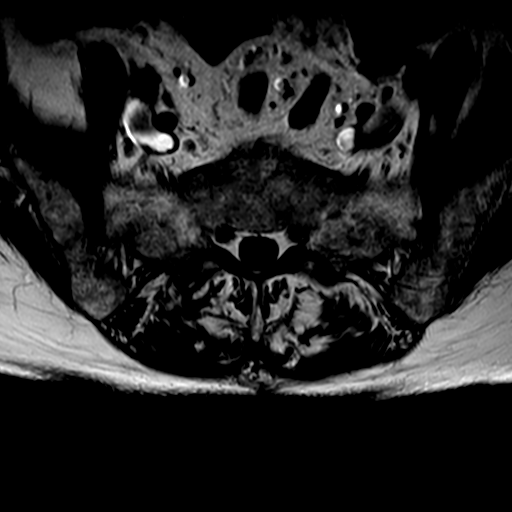
[im 7/35]
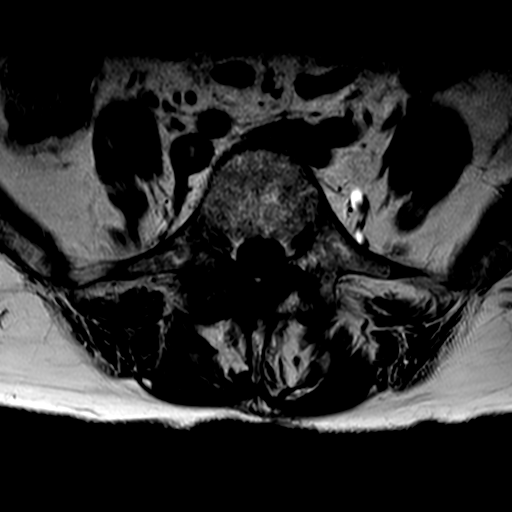
[im 10/35]
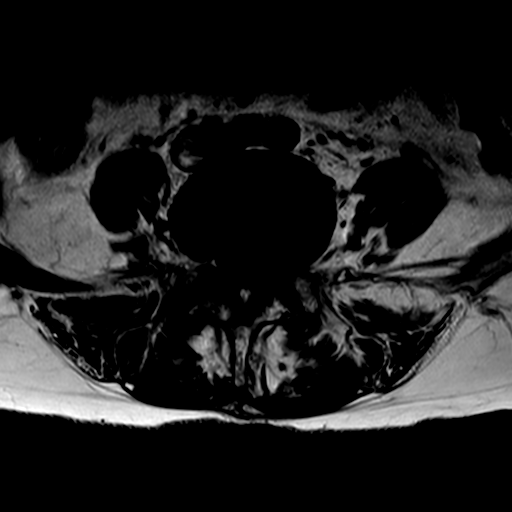
[im 16/35]
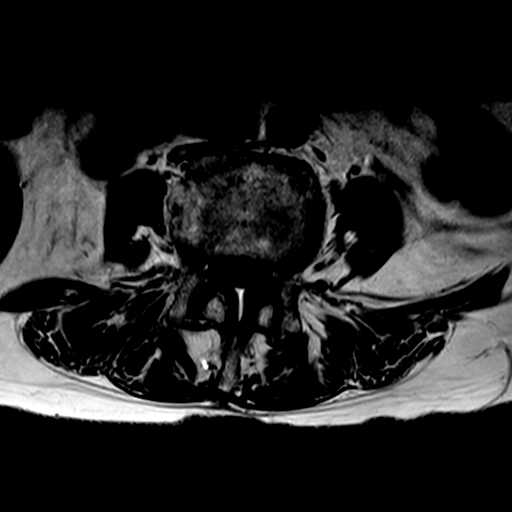
[im 19/35]
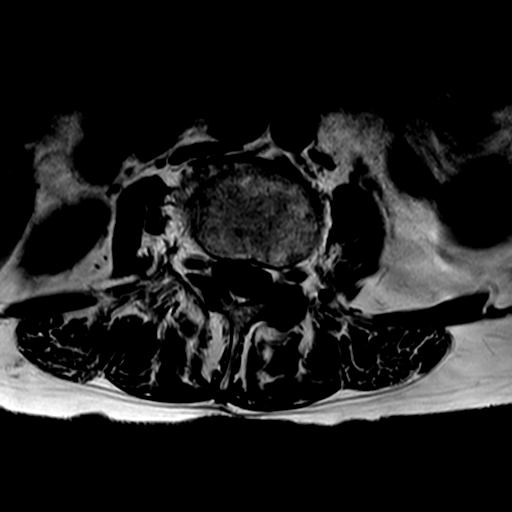
[im 25/35]
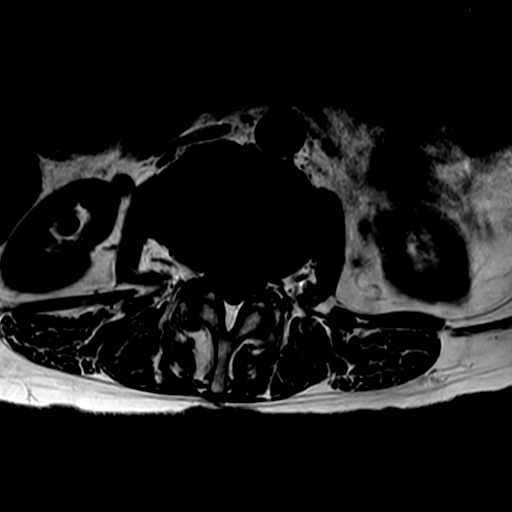
[im 28/35]
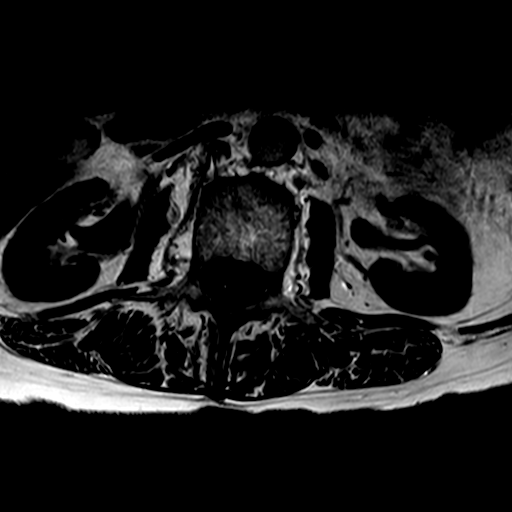
[im 31/35]
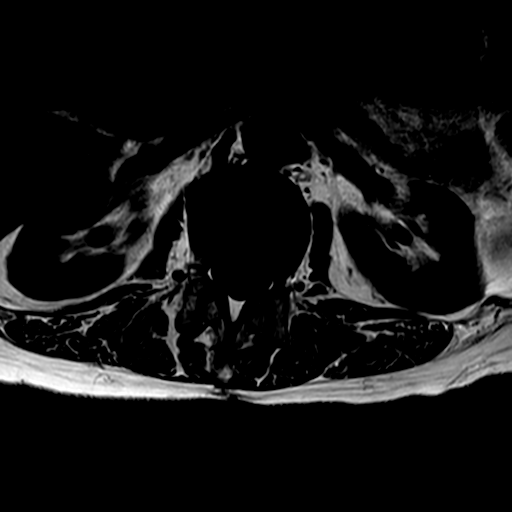

[15 of 48 positions shown; findings below may reference images not displayed]

FINDINGS: Lumbar vertebral heights are intact. There is marked disc narrowing at 
L2-3. Other disc heights are preserved. There is mild upper lumbar 
levoscoliosis. Sagittal STIR images show reactive edema involving the L4-5 facet 
joints, worse on the right. 
The conus is normal, terminating at T12-L1. No evidence for compression fracture 
or malignancy. No listhesis. There is Riedels configuration of the liver, 
vertical span of 19 cm. 
At L5-S1 the canal and foramina are open. 
At L4-5 there is mild canal stenosis, with mild broad-based disc bulge and 
moderate to marked facet degenerative change with ligamentous thickening 
encroaching on the exiting L5 nerve roots. This appears slightly worse on the 
left, axial T2 image 11. There is mild to moderate left foraminal stenosis. 
Right foramen open. 
At L3-4 there is a left paracentral disc extrusion superimposed on broad-based 
disc bulge best seen on axial T2 image 17. Disc extrusion appears 
subligamentous, extending 11 x 4 mm. This deforms the thecal sac and contributes 
to moderately severe canal stenosis. There is impingement of the L4 nerve roots 
bilaterally as they exit the dural tube. There is marked facet degenerative 
change with ligamentous thickening at this level and mild dorsal epidural 
lipomatosis. Foramina are open. 
At L2-3 there is mild to moderate right-sided canal stenosis encroaching on the 
exiting right L3 nerve root due to broad-based disc bulge and ligamentous 
thickening, axial image 25. Foramina are open. 
At L1-2 the canal and foramina are open. .
IMPRESSION: Large disc extrusion at L3-4 as described contributing to moderately severe 
canal stenosis, impinging the L4 nerve roots bilaterally. 
Mild to moderate right-sided canal stenosis at L2-3 due to disc bulge and 
ligamentous thickening. 
Facet degenerative changes throughout the lumbar spine. There is reactive edema 
involving the L4-5 facet joints, appearing worse on the right. 
Mild upper lumbar levoscoliosis. 
No evidence for compression fracture, listhesis, or spinal malignancy. Minimal 
reactive endplate degenerative edema at L2-3.

## 2021-09-29 IMAGING — DX HIP BILATERAL WITH PELVIS 5 VIEWS
1 series · 5 of 5 positions shown · non-contrast
Comparison: 10/23/2018 CT pelvis

________________________________________________________________________________________________ 
HIP BILATERAL WITH PELVIS 5 VIEWS, 09/29/2021 [DATE]: 
CLINICAL INDICATION: Monoarthritis, not elsewhere classified, unspecified hip.

[Series 1: AP · U · 0.14mm/px · 5 of 5 slices shown]
[im 1/5]
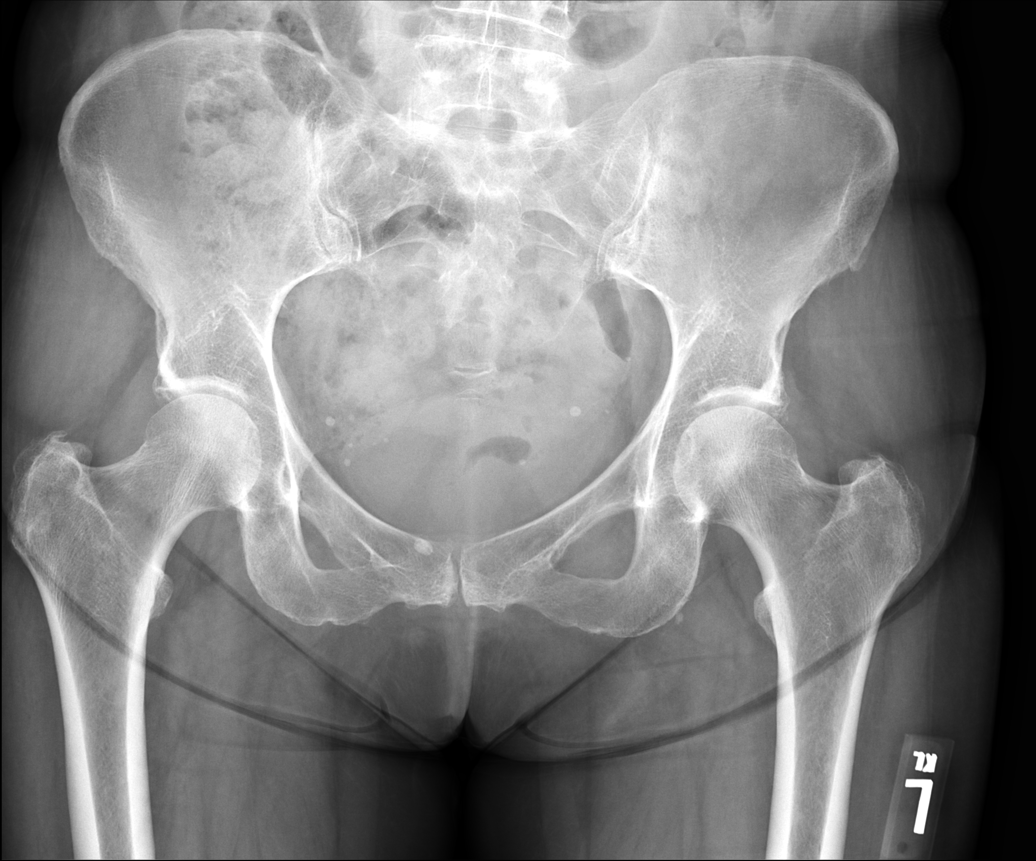
[im 2/5]
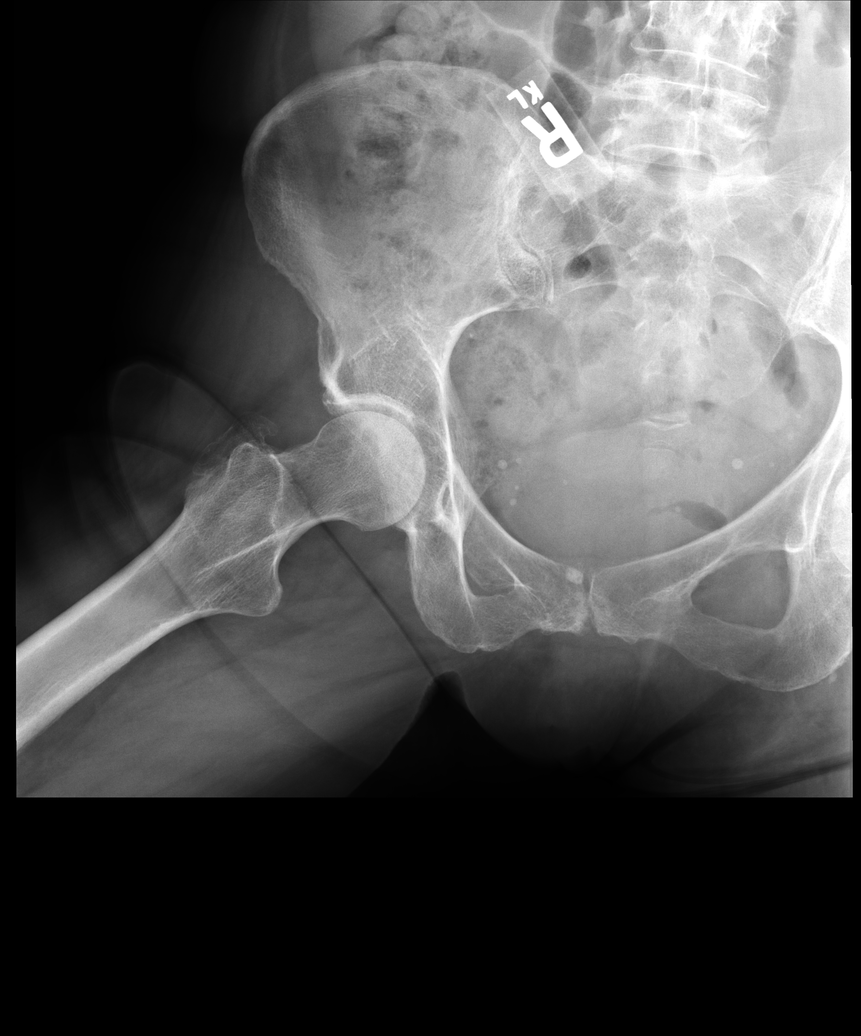
[im 3/5]
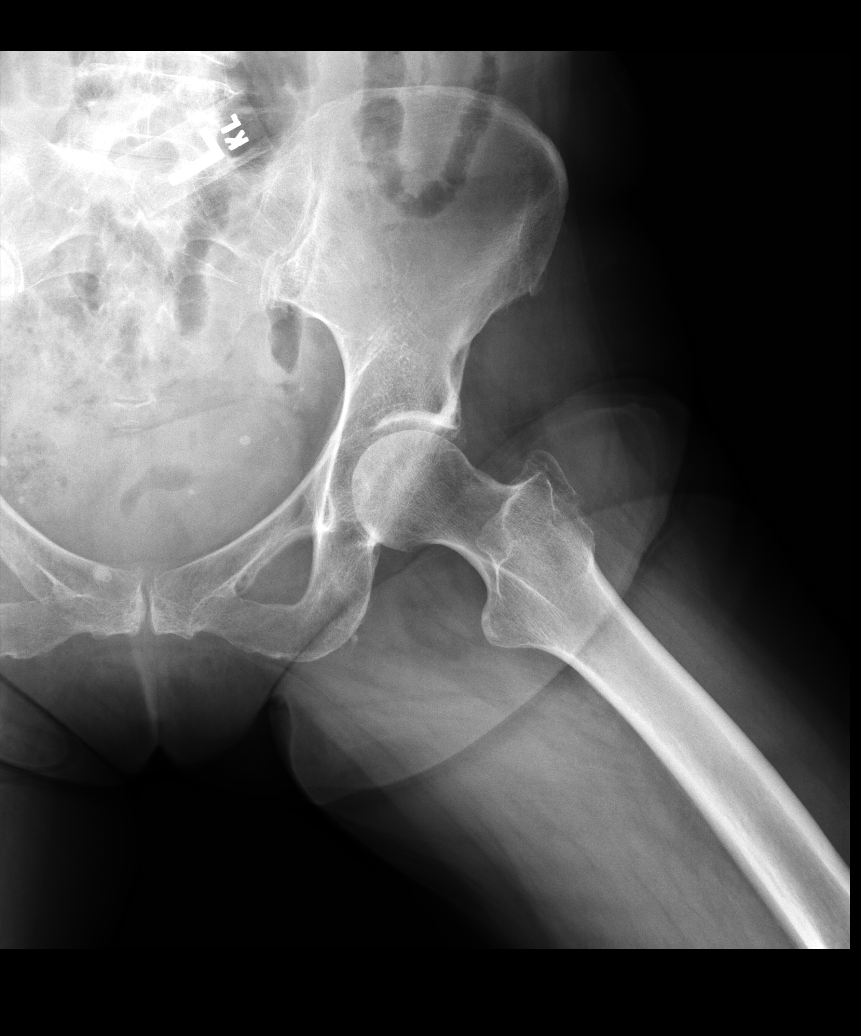
[im 4/5]
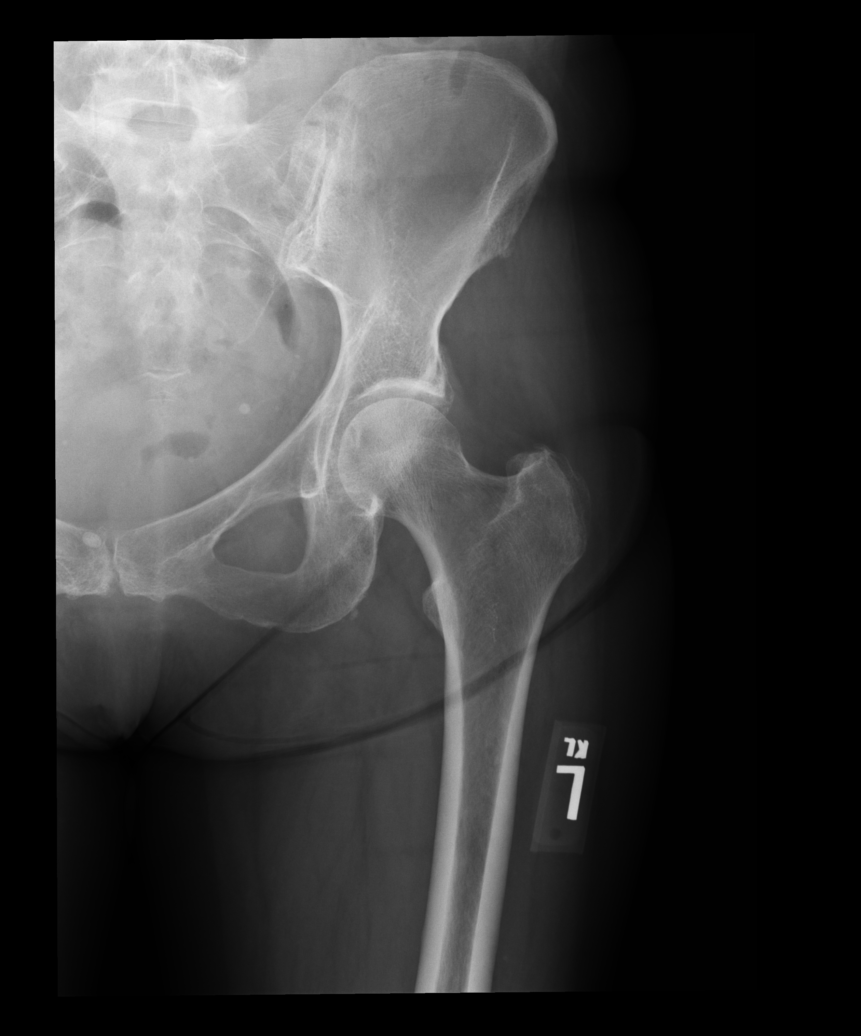
[im 5/5]
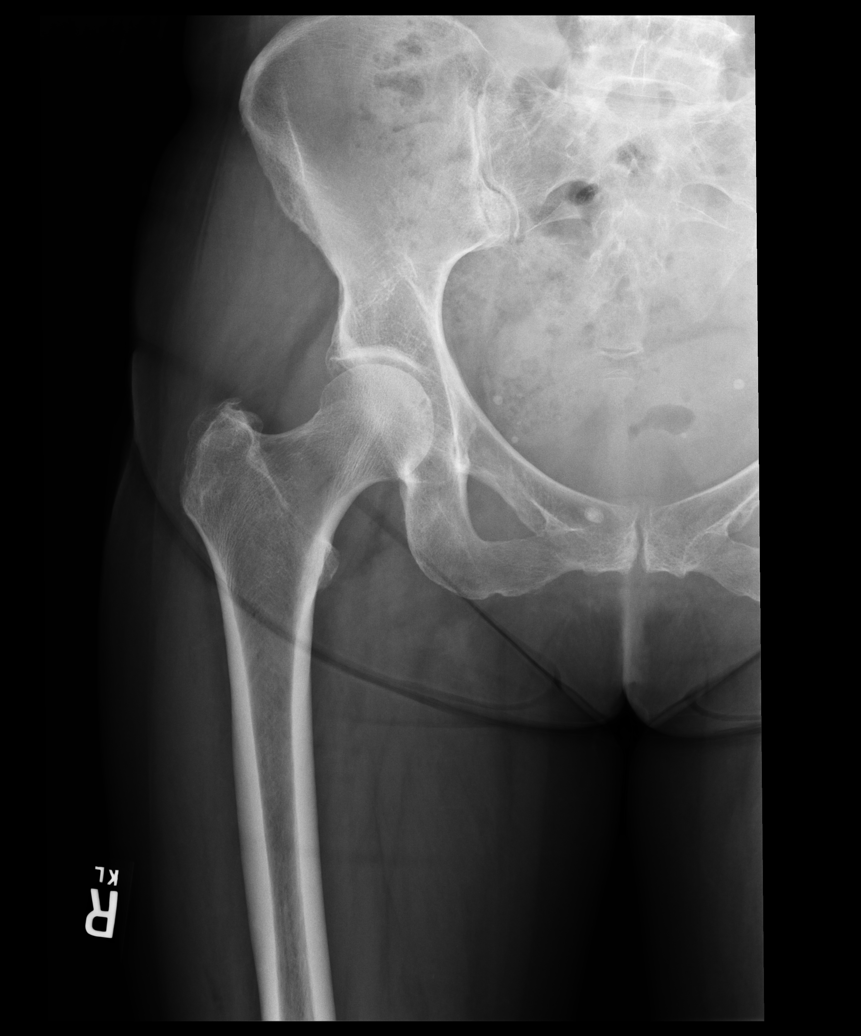

[5 of 5 positions shown; findings below may reference images not displayed]

FINDINGS: No fracture. Normal alignment. Hip joint space heights are 
maintained. Mild acetabular spurring and small left os supra-acetabular. 
Moderate degenerative change of the pubic symphysis. Mild degenerative change of 
the SI joints and spine. Osteopenia. Pelvic thickness.
IMPRESSION: 1.  Degenerative change of the hips and multifocal degenerative change of the 
pelvis/spine. 
2.  Osteopenia: DXA may be helpful for further evaluation.

## 2021-10-24 IMAGING — MR MRI PITUITARY W/WO CONTRAST
9 series · 38 of 48 positions shown · IV contrast (gadavist)
Comparison: Pituitary MRI April 13, 2021

________________________________________________________________________________________________ 
MRI PITUITARY W/WO CONTRAST, 10/24/2021 [DATE]: 
CLINICAL INDICATION: Follow-up pituitary adenoma, headache base of skull
TECHNIQUE: Thin section sagittal and coronal T1, coronal T2 images through the 
sella were acquired prior to contrast. Axial FLAIR images of the entire brain 
also obtained. After the intravenous administration of 5.5 ccs of Gadavist 
injected intravenously by hand, dynamic enhanced coronal images through the 
sella were obtained as well as static thin section sagittal and coronal T1 
images through the sella. Subsequently, enhanced axial fat suppressed T1 
sequences through the entire brain were acquired. The patients eGFR was 
calculated to be 69.6 mL/min/1.73 m2 using the i-STAT device.

[Series 101: survey · axial · 10.0mm · 0.98mm/px · 1 of 5 slices shown]
[im 1/5]
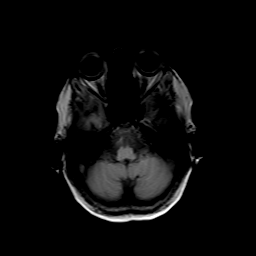

[Series 201: FLAIR · axial · 5.0mm · 0.62mm/px · z∈[-71,+78]mm · 6 of 27 slices shown]
[im 1/27]
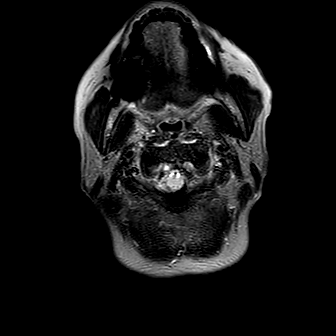
[im 6/27]
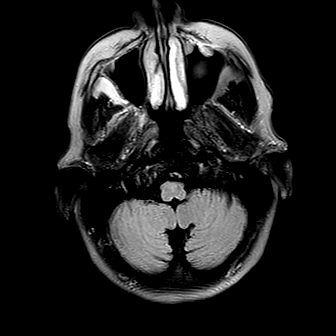
[im 11/27]
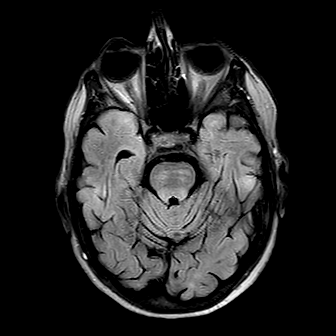
[im 16/27]
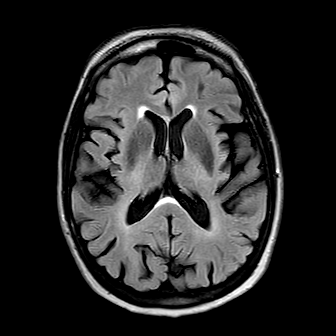
[im 21/27]
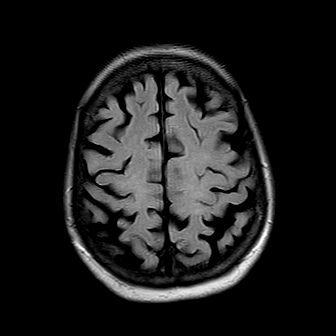
[im 27/27]
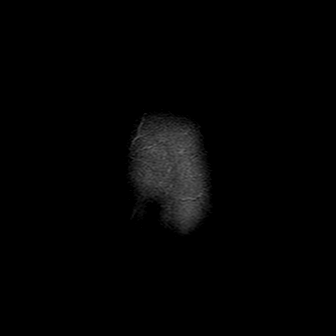

[Series 301: T1 · sagittal · 2.0mm · 0.47mm/px · 3 of 13 slices shown (1 of 4)]
[im 1/13]
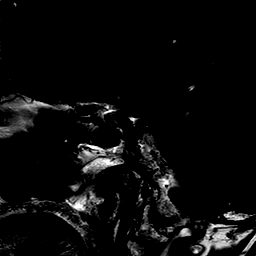
[im 7/13]
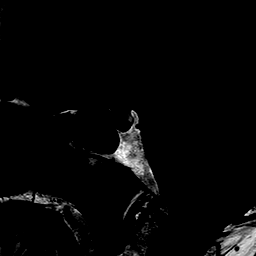
[im 13/13]
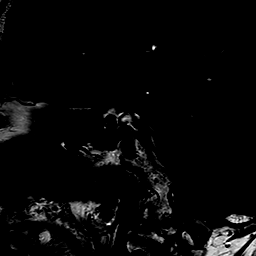

[Series 401: T1 · coronal · 2.5mm · 0.47mm/px · 4 of 15 slices shown (2 of 4)]
[im 1/15]
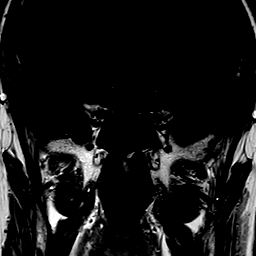
[im 5/15]
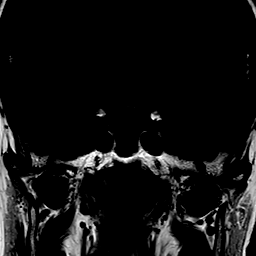
[im 10/15]
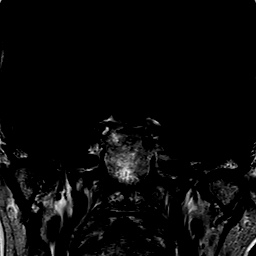
[im 15/15]
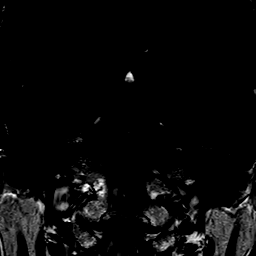

[Series 501: T2 · coronal · 2.5mm · 0.42mm/px · 4 of 15 slices shown]
[im 1/15]
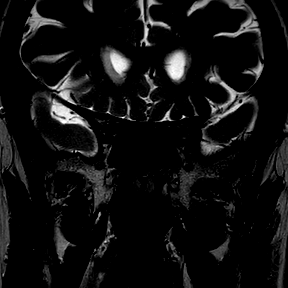
[im 5/15]
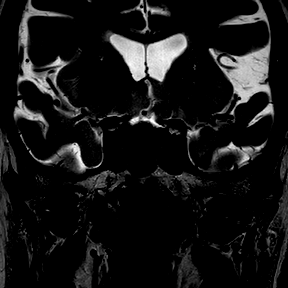
[im 10/15]
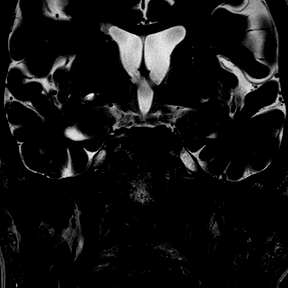
[im 15/15]
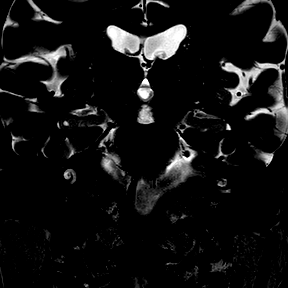

[Series 601: t1w_tse_dyn · coronal · 2.5mm · 0.56mm/px · 6 of 65 slices shown]
[im 5/65]
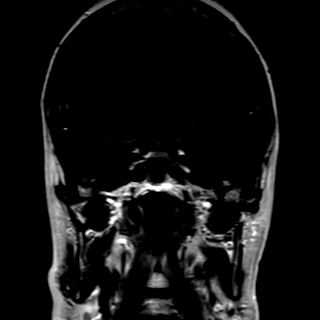
[im 13/65]
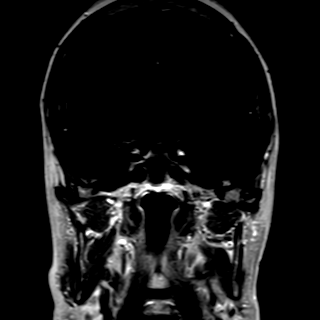
[im 22/65]
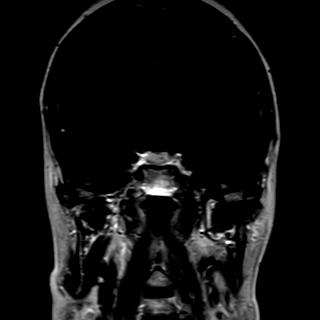
[im 30/65]
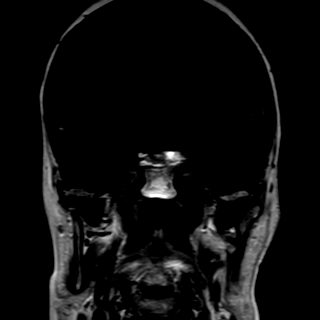
[im 39/65]
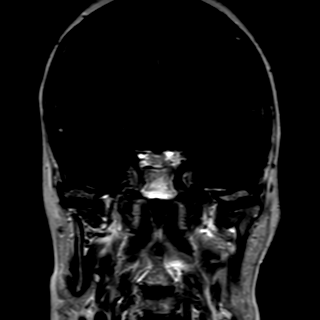
[im 47/65]
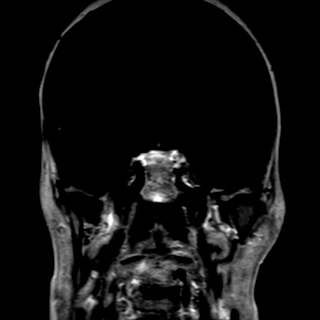

[Series 701: T1 · coronal · 2.5mm · 0.47mm/px · 4 of 15 slices shown (3 of 4)]
[im 1/15]
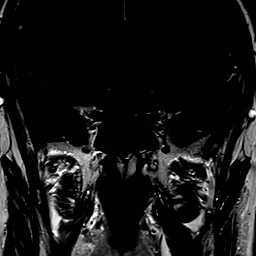
[im 5/15]
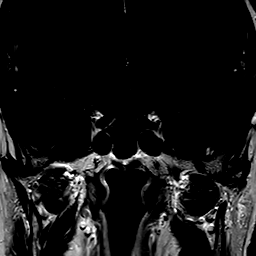
[im 10/15]
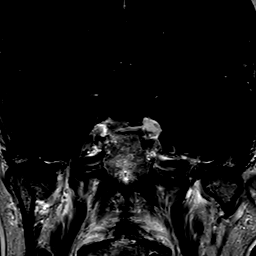
[im 15/15]
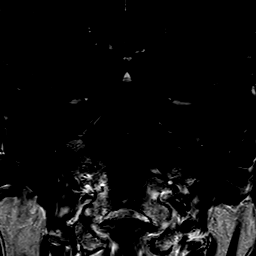

[Series 801: T1 · sagittal · 2.0mm · 0.47mm/px · 3 of 13 slices shown (4 of 4)]
[im 1/13]
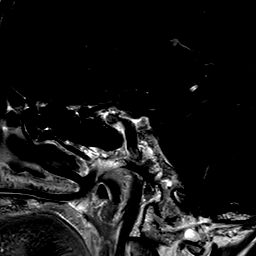
[im 7/13]
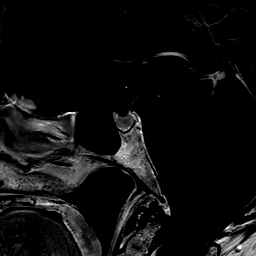
[im 13/13]
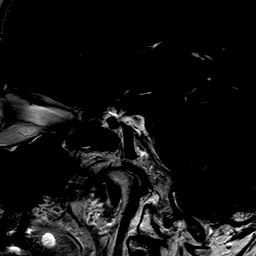

[Series 901: T1 post-contrast · axial · 5.0mm · 0.43mm/px · z∈[-72,+77]mm · 7 of 27 slices shown]
[im 1/27]
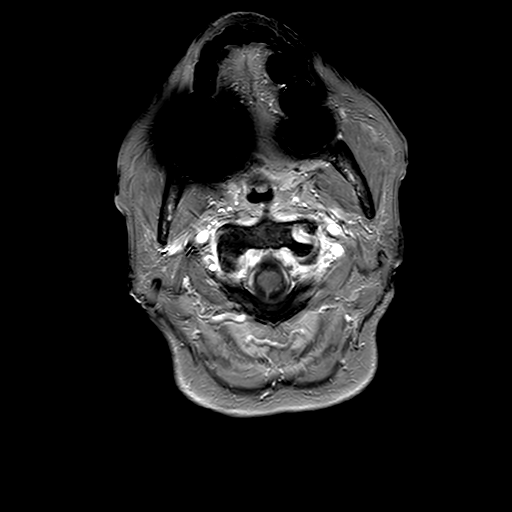
[im 5/27]
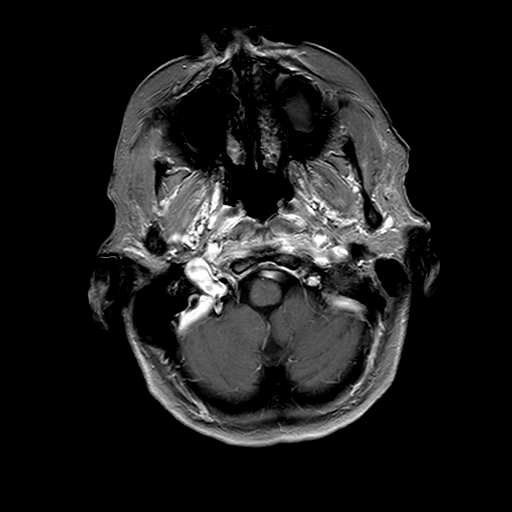
[im 9/27]
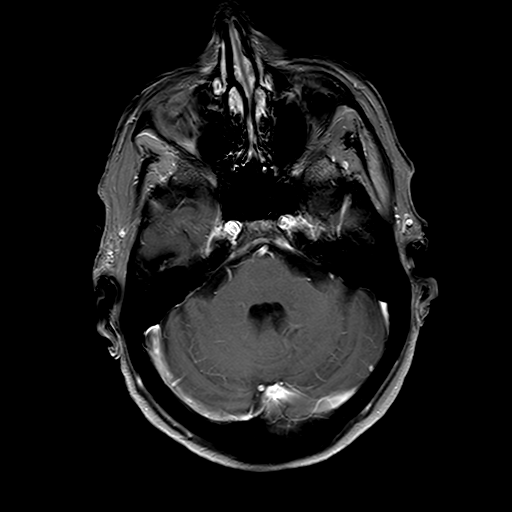
[im 14/27]
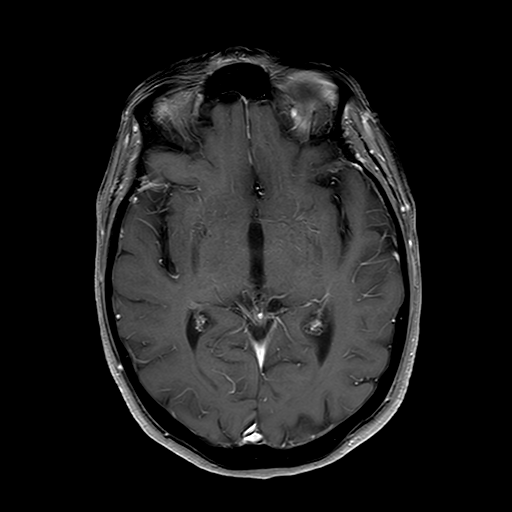
[im 18/27]
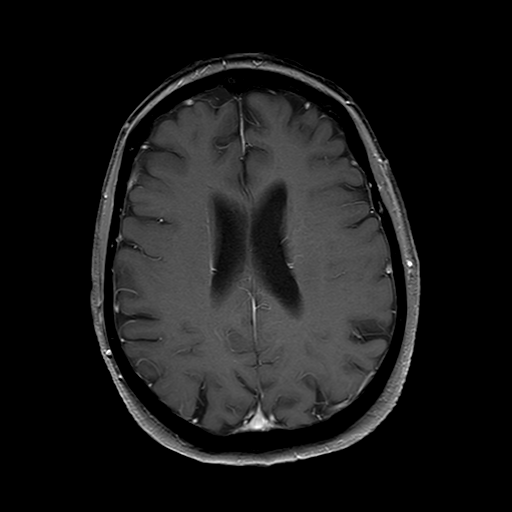
[im 22/27]
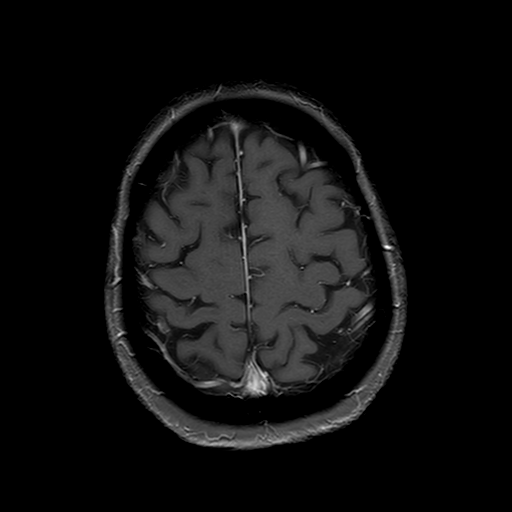
[im 27/27]
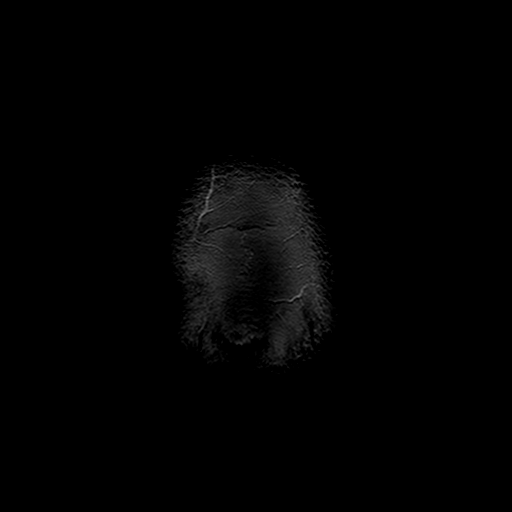

[38 of 48 positions shown; findings below may reference images not displayed]

FINDINGS: Again evident is asymmetric enhancement along the left superolateral 
margin of the sella, appearing to lie predominantly within the upper cavernous 
sinus. Measured on the postenhanced coronal image 10 this tissue extends 
approximately 6 x 3.5 mm, similar to the prior coronal enhanced image 11. This 
does not appear significantly changed in size. There is no significant washout 
on delayed images. There is only slight deformity on the lateral margin of the 
left adenohypophysis seen on static coronal image 8. Infundibulum is not 
distorted. No chiasmatic deformity. The left cavernous internal carotid artery 
is open, without evidence for encasement. 
Neurohypophysis shows normal signal and position. The sella is not expanded. 
Clivus marrow signal is normal. 
No other intracranial mass or pathologic enhancement. FLAIR images show minimal 
cerebral white matter microangiopathic changes. 4 mm T2 hyperintense focus in 
the right pons was present previously, likely chronic small vessel change. There 
is no cavitation. No hydrocephalus. 
There are clustered retention cysts in the left maxillary antrum appearing 
decreased in size compared to the prior study.
IMPRESSION: No change in abnormal enhancement along the left superolateral margin of the 
sella, appearing to lie predominantly within the upper cavernous sinus. There is 
no evidence for arterial encasement. 
No new intracranial mass. No evidence for recent infarct, hemorrhage or 
hydrocephalus. 
Improvement in left maxillary sinus mucosal thickening and retention cysts.

## 2021-11-13 IMAGING — MR MRI CERVICAL SPINE WITHOUT CONTRAST
6 series · 37 of 48 positions shown · IV contrast (gadolinium)
Comparison: Cervical MRI April 19, 2021

________________________________________________________________________________________________ 
MRI CERVICAL SPINE WITHOUT CONTRAST, 11/13/2021 [DATE]: 
CLINICAL INDICATION: Spondylosis without myelopathy or radiculopathy, neck pain
TECHNIQUE: Sagittal T1, Sagittal T2, Sagittal STIR, Axial TSE and Axial [id number] 
images of the cervical spine were performed without intravenous gadolinium 
enhancement. Patient was premedicated with Xanax

[Series 101: survey · axial · 10.0mm · 0.94mm/px · z∈[-15,+150]mm · 4 of 9 slices shown]
[im 1/9]
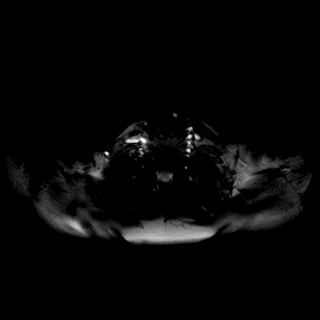
[im 3/9]
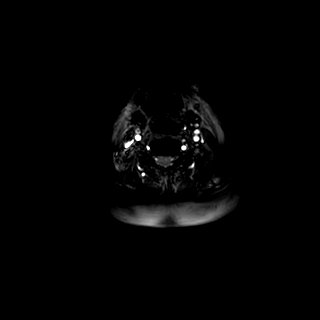
[im 6/9]
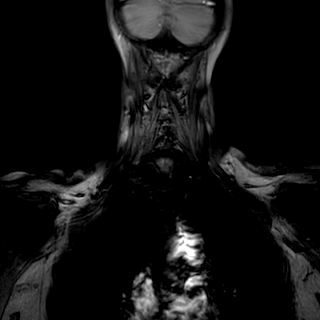
[im 9/9]
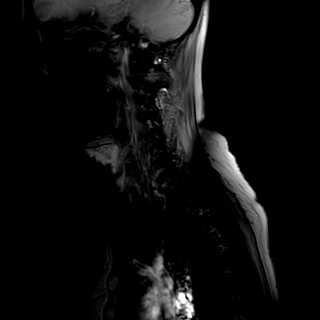

[Series 201: t2w_tse cor · coronal · 5.0mm · 0.52mm/px · 3 of 7 slices shown]
[im 1/7]
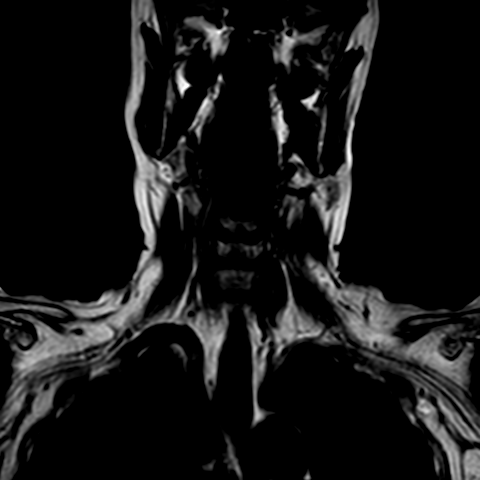
[im 4/7]
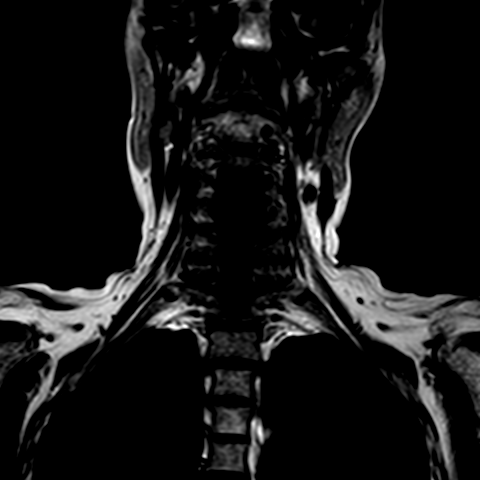
[im 7/7]
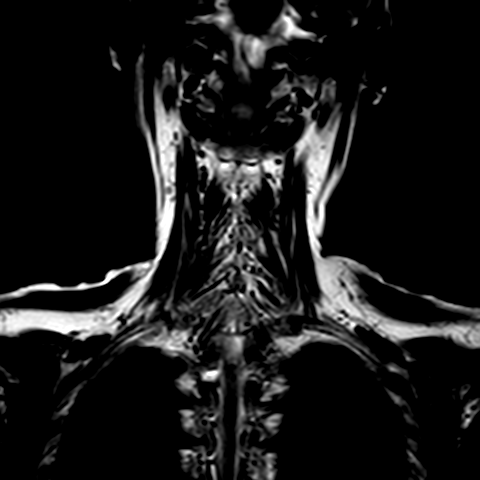

[Series 301: t1w_tse sag · sagittal · 3.0mm · 0.45mm/px · 7 of 15 slices shown]
[im 1/15]
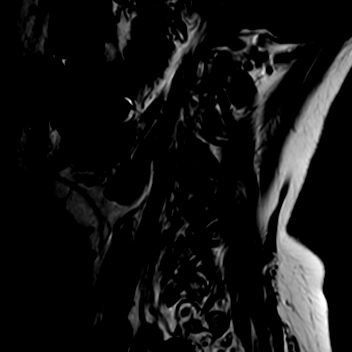
[im 3/15]
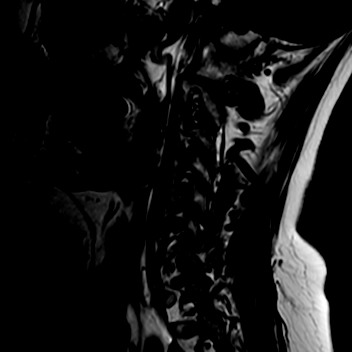
[im 5/15]
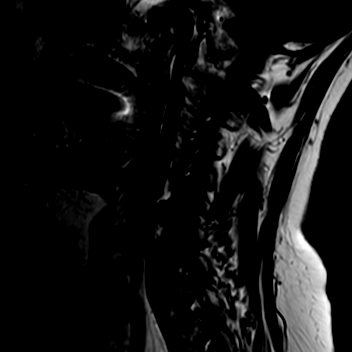
[im 8/15]
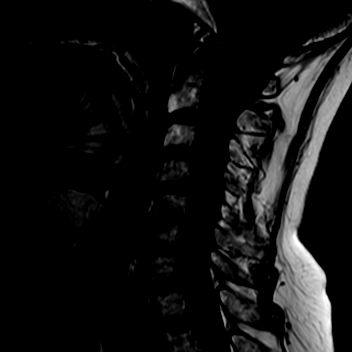
[im 10/15]
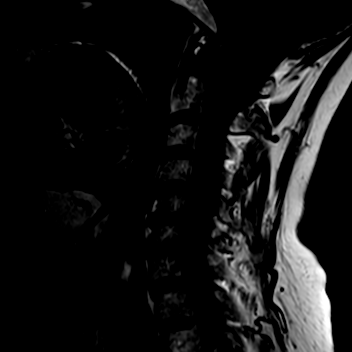
[im 12/15]
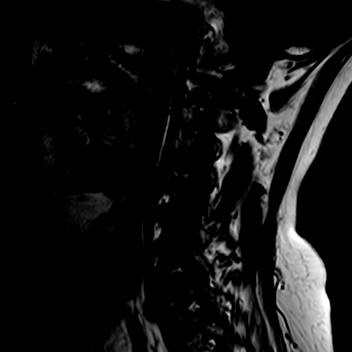
[im 15/15]
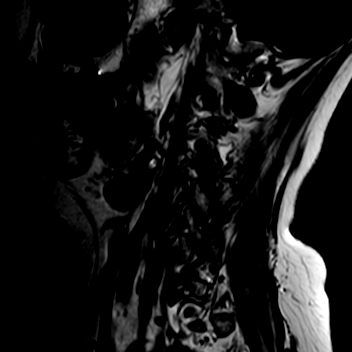

[Series 401: t2w_tse sag · sagittal · 3.0mm · 0.50mm/px · 7 of 15 slices shown]
[im 1/15]
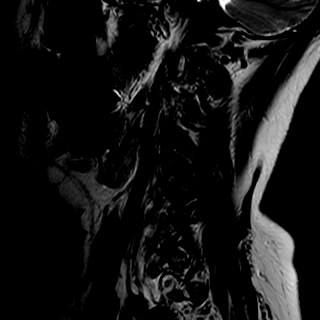
[im 3/15]
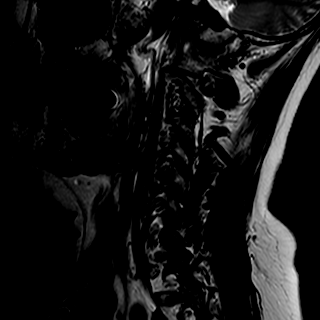
[im 5/15]
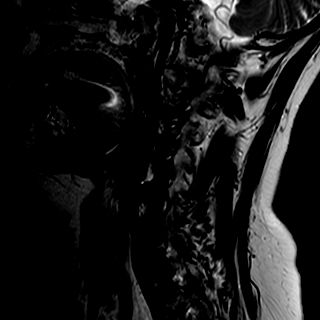
[im 8/15]
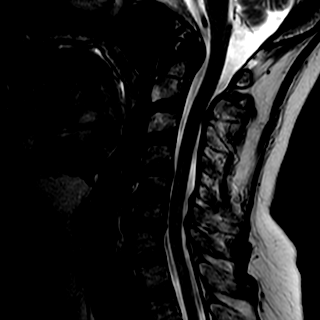
[im 10/15]
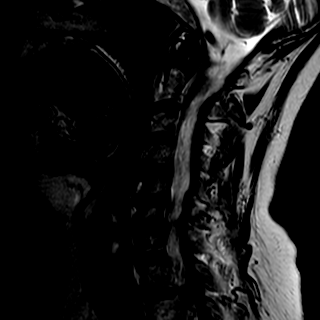
[im 12/15]
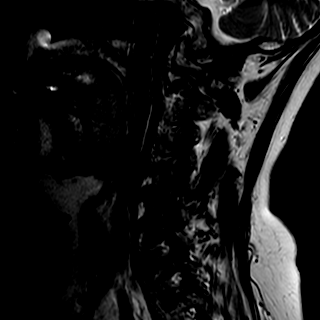
[im 15/15]
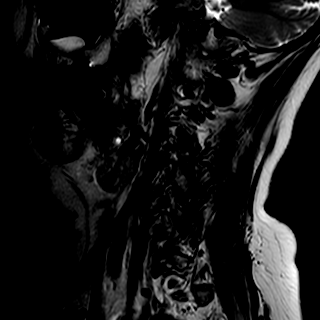

[Series 501: stir_longte sag · sagittal · 3.0mm · 0.62mm/px · 7 of 15 slices shown]
[im 1/15]
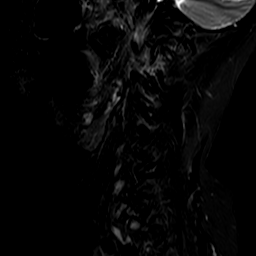
[im 3/15]
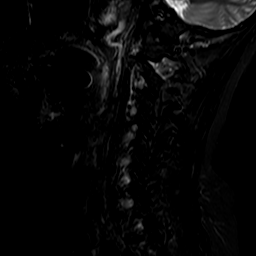
[im 5/15]
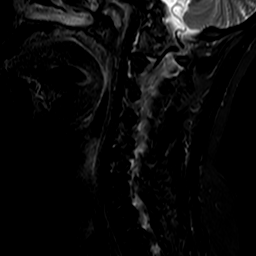
[im 8/15]
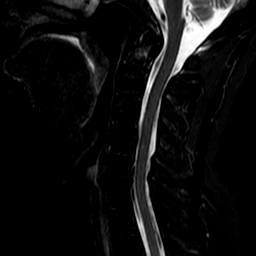
[im 10/15]
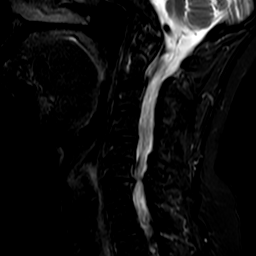
[im 12/15]
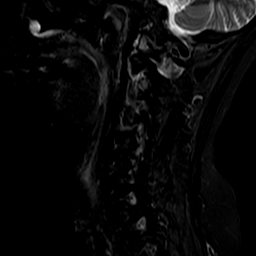
[im 15/15]
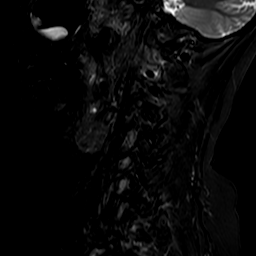

[Series 601: t2w_tse_ax · axial · 3.0mm · 0.36mm/px · z∈[-53,+73]mm · 9 of 45 slices shown]
[im 1/45]
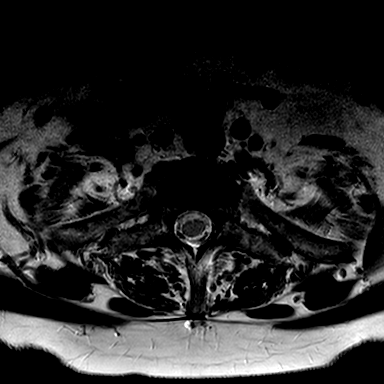
[im 3/45]
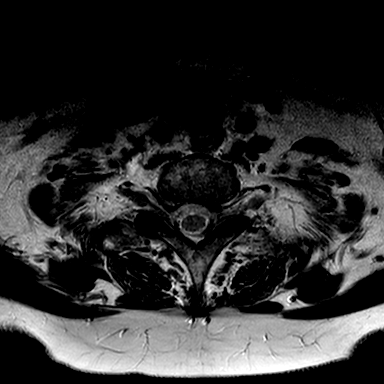
[im 7/45]
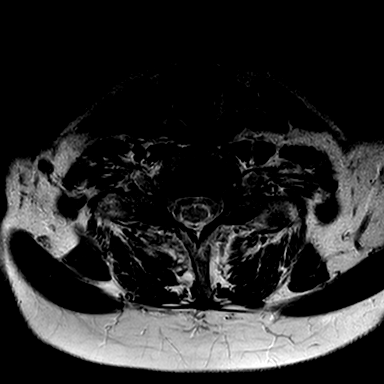
[im 14/45]
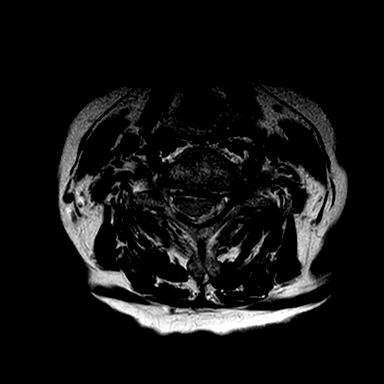
[im 19/45]
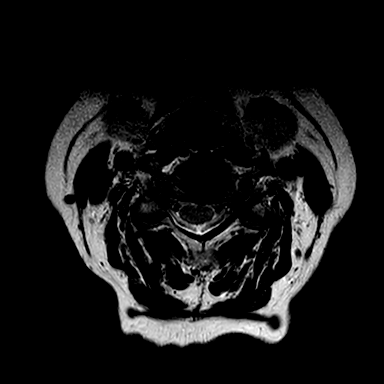
[im 26/45]
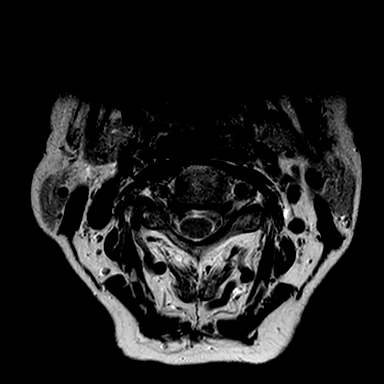
[im 31/45]
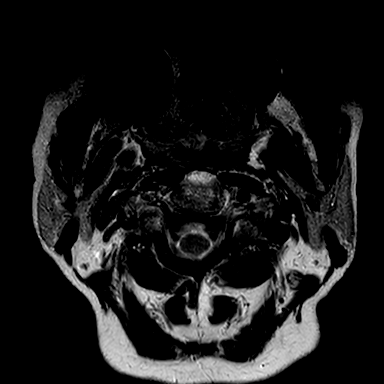
[im 38/45]
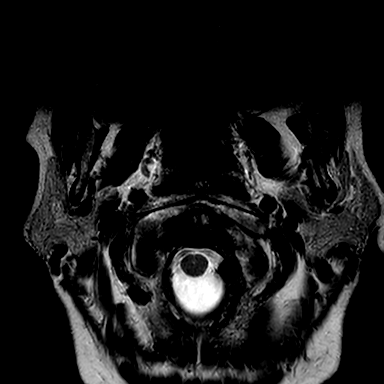
[im 42/45]
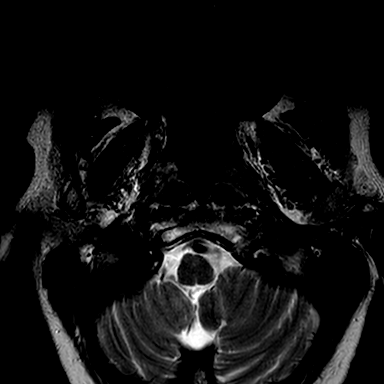

[37 of 48 positions shown; findings below may reference images not displayed]

FINDINGS: Cervical vertebral heights are intact. There is cervical spondylosis. 
There is marked disc narrowing at C5-6 and along the posterior C6-7 interspace, 
unchanged. Allowing for mild motion artifact cord signal appears normal. No 
evidence for fracture. The dens is intact. There are mild atlantoaxial 
degenerative changes. The craniocervical junction is open. There is mild edema 
at the base of the dens and involving the left atlantoaxial articulation, most 
likely degenerative reactive change. 
At C2-3 the canal and foramina are open and the disc margin is intact. 
At C3-4 the canal and foramina are open, disc margin is intact. 
At C4-5 there is mild midline bulge approximating the cord without deformity. 
The canal and foramina remain open. 
At C5-6 there is mild disc-osteophyte approximating the cord without deformity. 
Mid sagittal canal diameter is 9 mm. There is moderate to marked left foraminal 
stenosis, axial image 16, mild on the right. 
At C6-7 there is disc-osteophyte approximating the cord without deformity. This 
appears slightly improved compared to the prior MRI where the disc margin 
touches the cord. Mid sagittal canal diameter is 8 mm, not significantly 
different. There is moderate-marked left foraminal stenosis, mild on the right, 
axial T2 images [DATE]. 
At C7-T1 the canal and foramina are open.
IMPRESSION: Spondylosis. There is mild canal stenosis at C5-6 and C6-7 without cord 
deformity. There is moderate to marked left foraminal stenosis at both levels. 
Findings are not significantly different from the prior examination. 
Mild straightening lordosis reidentified, potentially indicating muscle spasm. 
Atlantoaxial degenerative changes. There is mild edema at the base of the dens 
and involving the left atlantoaxial articulation, most likely degenerative 
reactive change. No fracture identified. This edema was not present on the prior 
study. If indicated cervical CT would be useful to better evaluate osseous 
integrity of the structures. 
Allowing for motion artifact cord signal appears normal. 
No evidence for spinal malignancy. 
Small left submandibular gland sialocele, unchanged from the prior study. This 
is most likely chronic inflammatory sequelae.

## 2022-02-05 IMAGING — MG MAMMOGRAPHY SCREENING BILATERAL 3[PERSON_NAME]
8 series · 9 of 24 positions shown · non-contrast
Comparison: Comparison was made to prior examinations.

________________________________________________________________________________________________ 
MAMMOGRAPHY SCREENING BILATERAL 3SIAVASH ONOFRE, 02/05/2022 [DATE]: 
CLINICAL INDICATION: Screening.
TECHNIQUE: Digital bilateral mammograms and 3-D Tomosynthesis were obtained. 
These were interpreted both primarily and with the aid of computer-aided 
detection system.  
BREAST DENSITY: (Level B) There are scattered areas of fibroglandular density.

[R MLO]
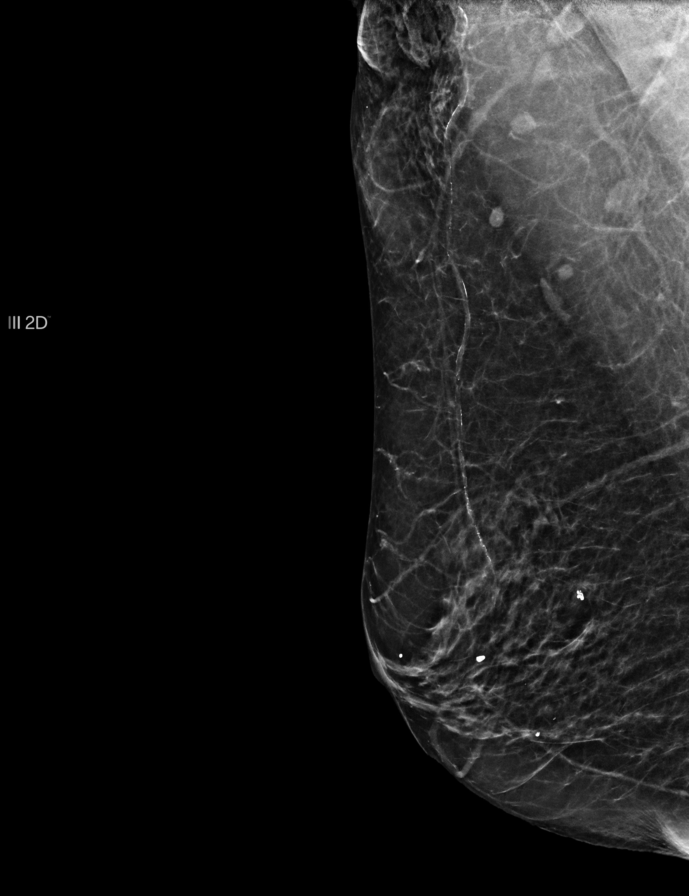

[L MLO]
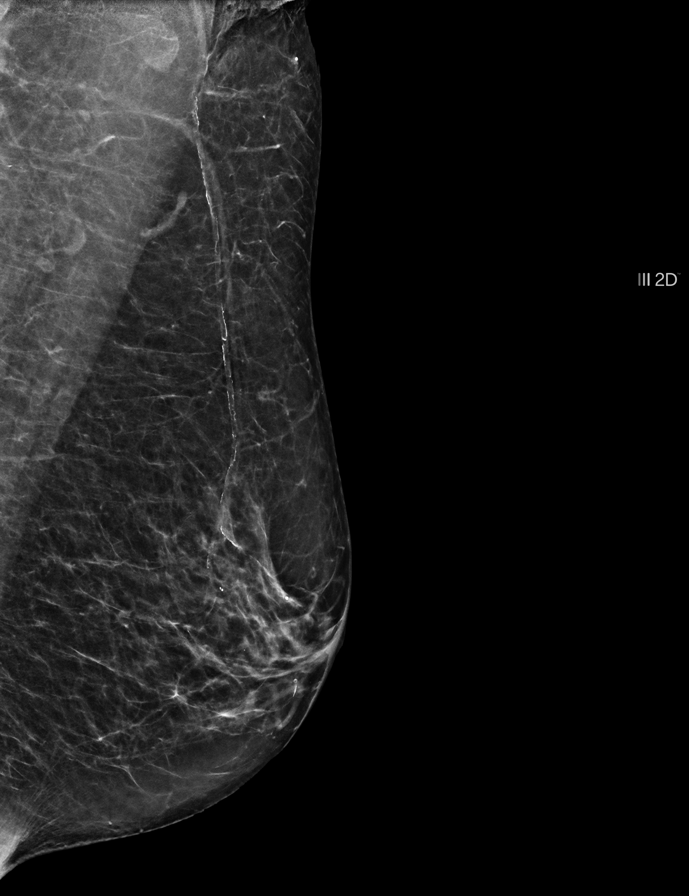

[L CC]
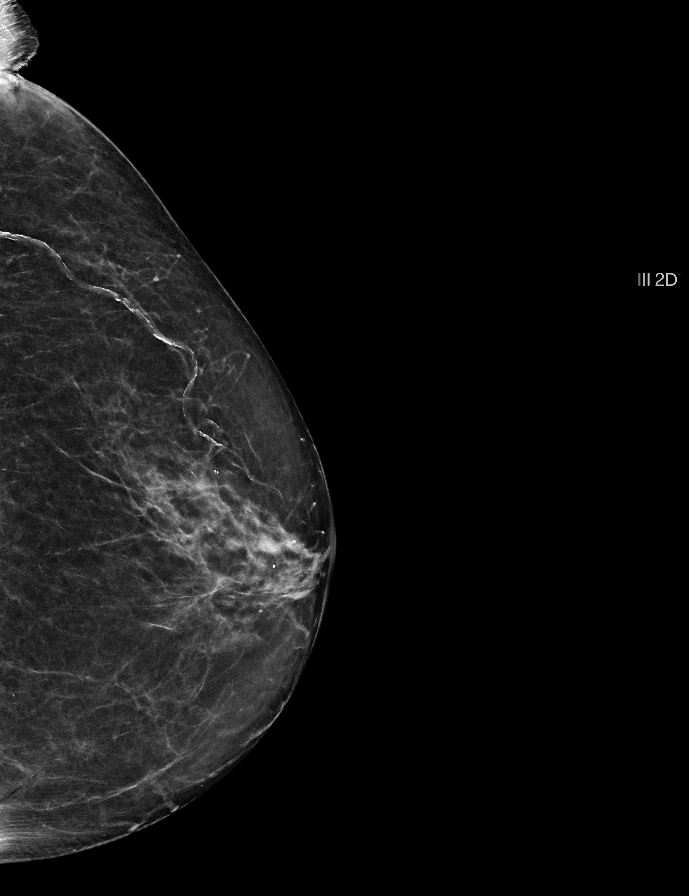

[R CC]
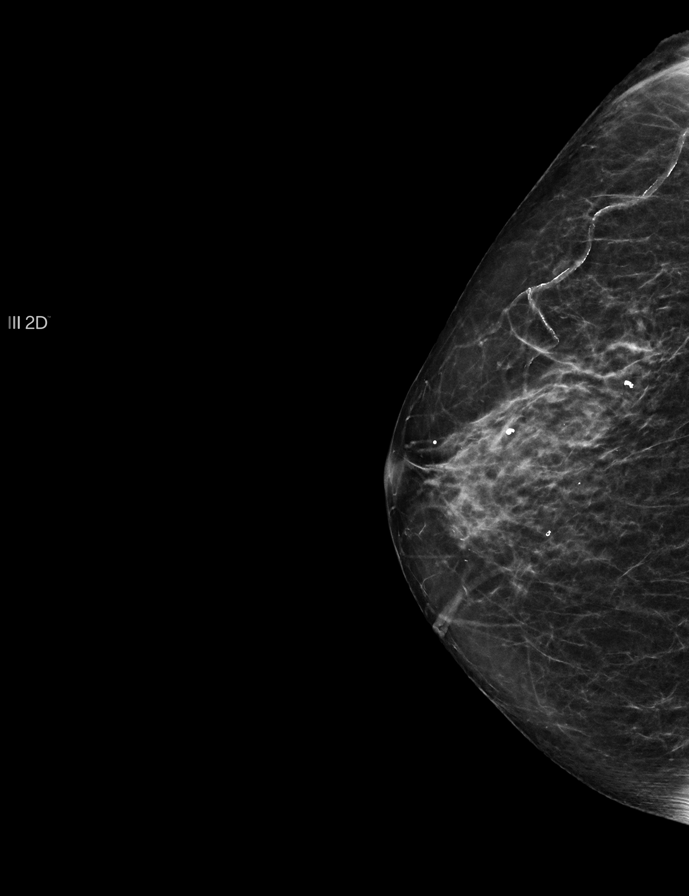

[L MLO tomo · 2 of 53 frames shown]
[frame 18/53]
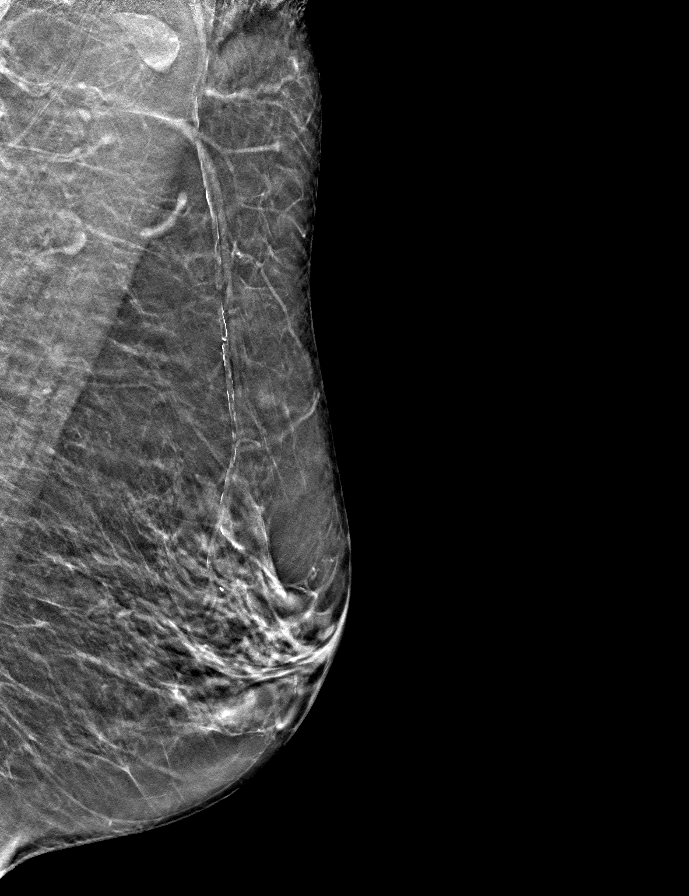
[frame 27/53]
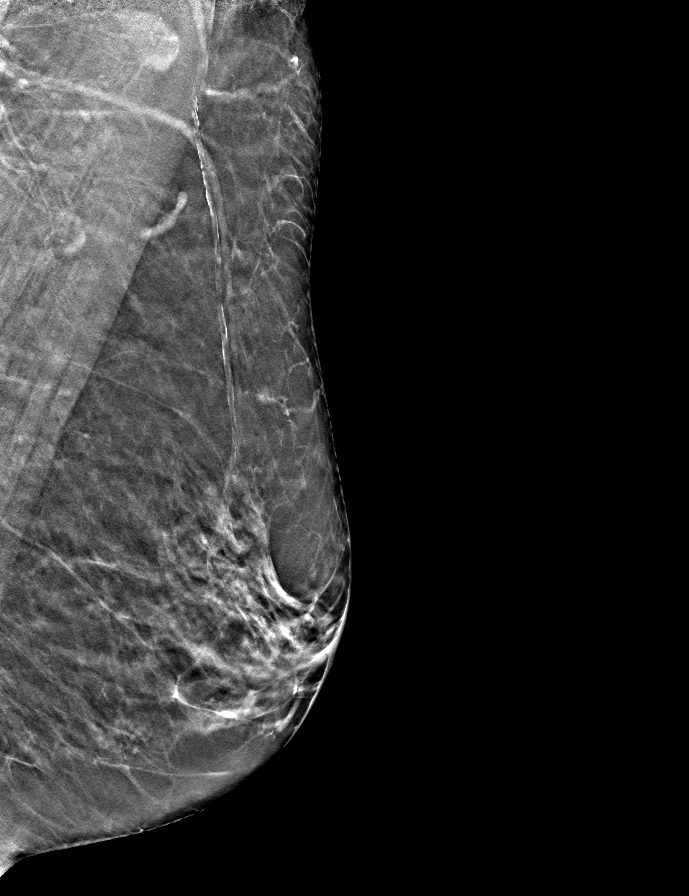

[R MLO tomo · tomo slice 27/53.0]
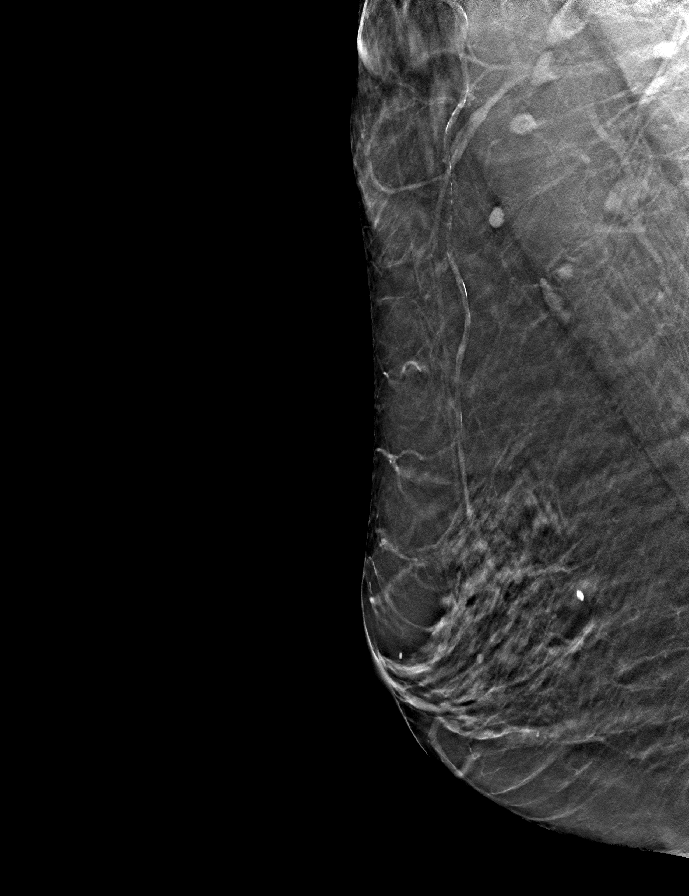

[L CC tomo · tomo slice 27/53.0]
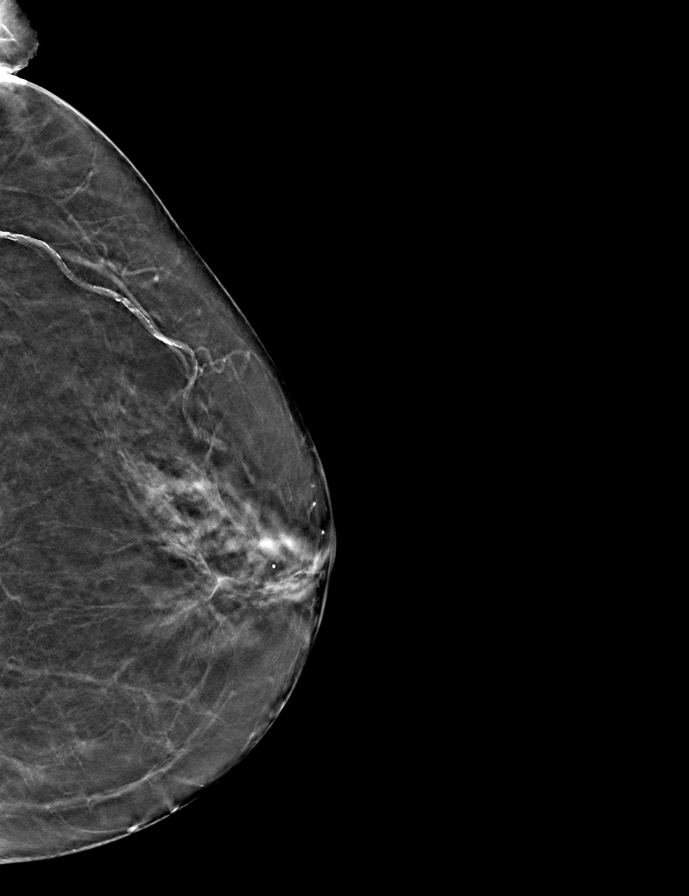

[R CC tomo · tomo slice 25/49.0]
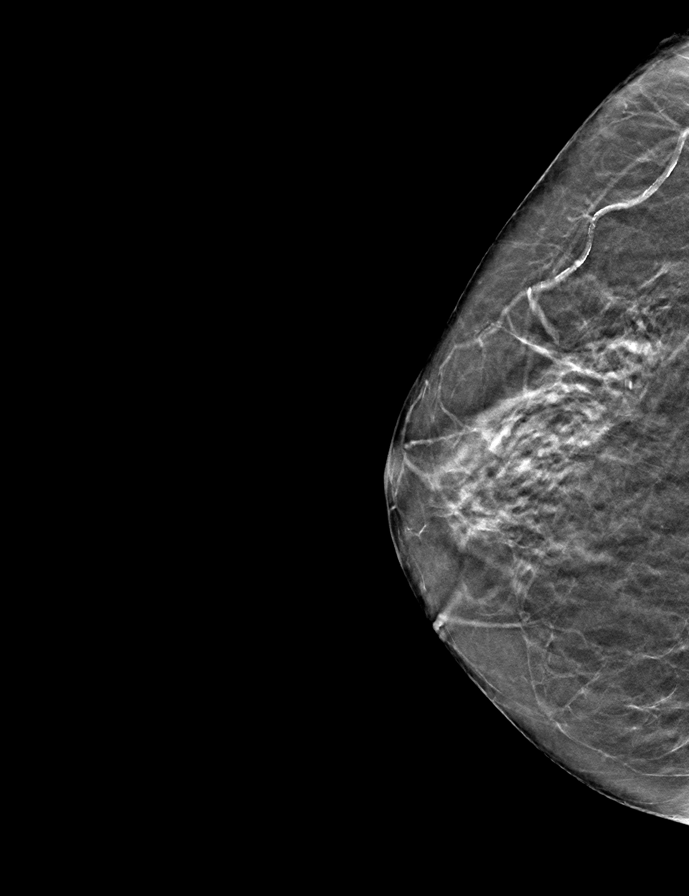

[9 of 24 positions shown; findings below may reference images not displayed]

FINDINGS: No mammographically suspicious abnormality and no significant change from prior 
mammograms.
IMPRESSION: (BI-RADS 1) Negative mammogram. Routine mammographic follow-up is recommended.

## 2022-07-24 IMAGING — MG MAMMOGRAPHY DIAGNOSTIC BILATERAL 3[PERSON_NAME]
8 series · 8 of 24 positions shown · non-contrast
Comparison: 02/05/2022 and dating back to 08/25/2012

________________________________________________________________________________________________ 
MAMMOGRAPHY DIAGNOSTIC BILATERAL 3DODO, GERDL BREAST ULTRASOUND UNILATERAL 
LIMITED, 07/24/2022 [DATE]: 
CLINICAL INDICATION: Bilateral breast pain, left greater than right, under 
breast and left axilla since 04/08/2022. History of bilateral breast biopsies.
TECHNIQUE: Mammogram: Digital bilateral mammograms and 3-D Tomosynthesis were 
obtained. These were interpreted both primarily and with the aid of 
computer-aided detection system. Ultrasound: Ultrasound was performed of the 
left breast at area of pain. 
BREAST DENSITY: (Level C) The breasts are heterogeneously dense, which may 
obscure small masses.

[R CC]
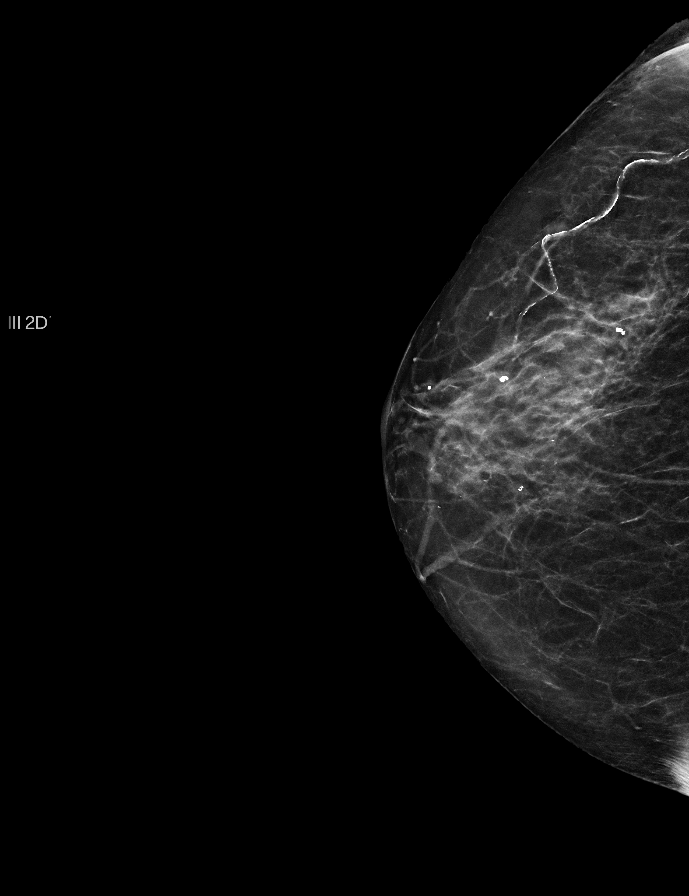

[L MLO]
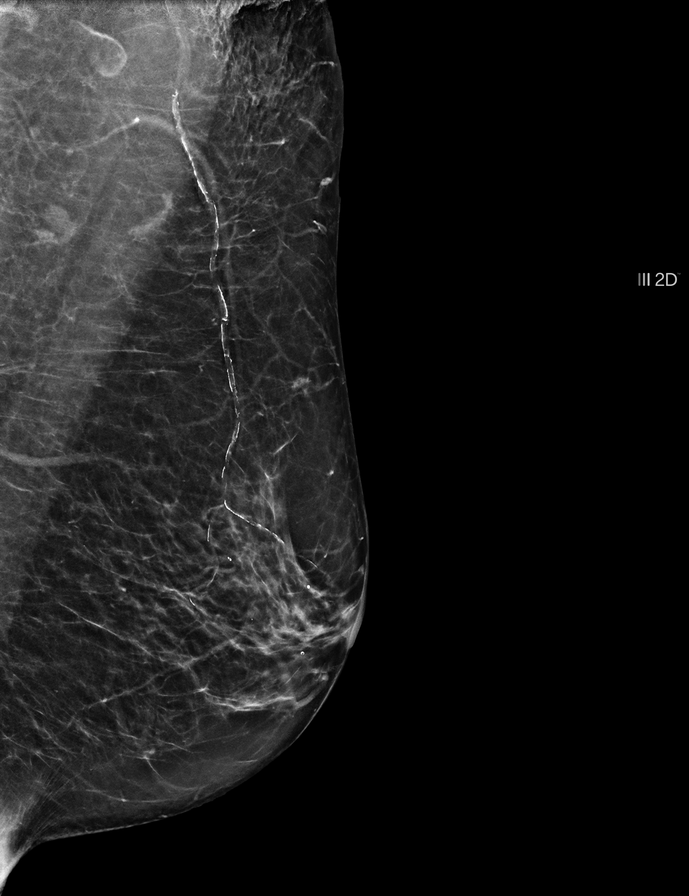

[L CC]
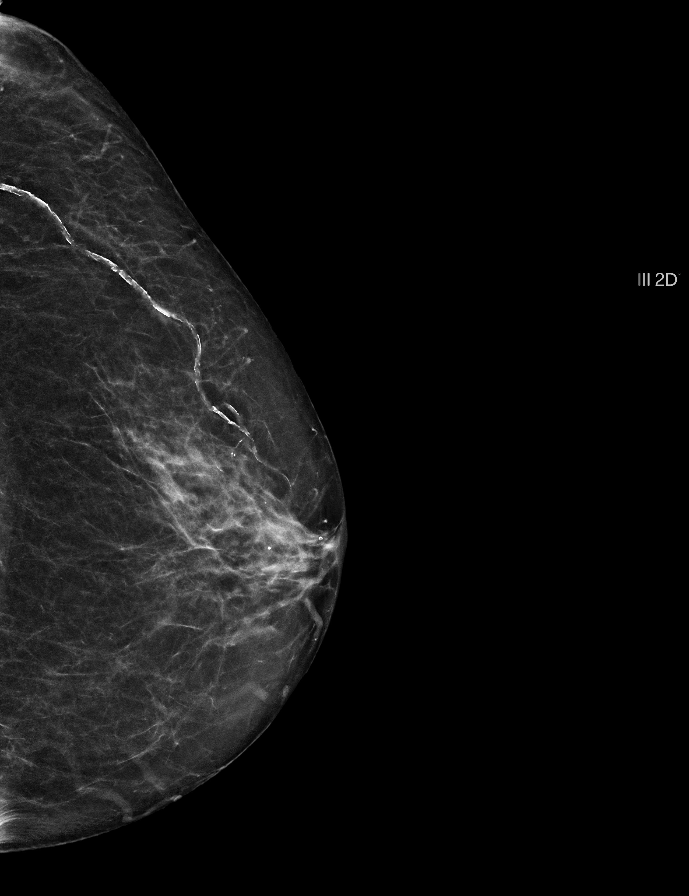

[R MLO]
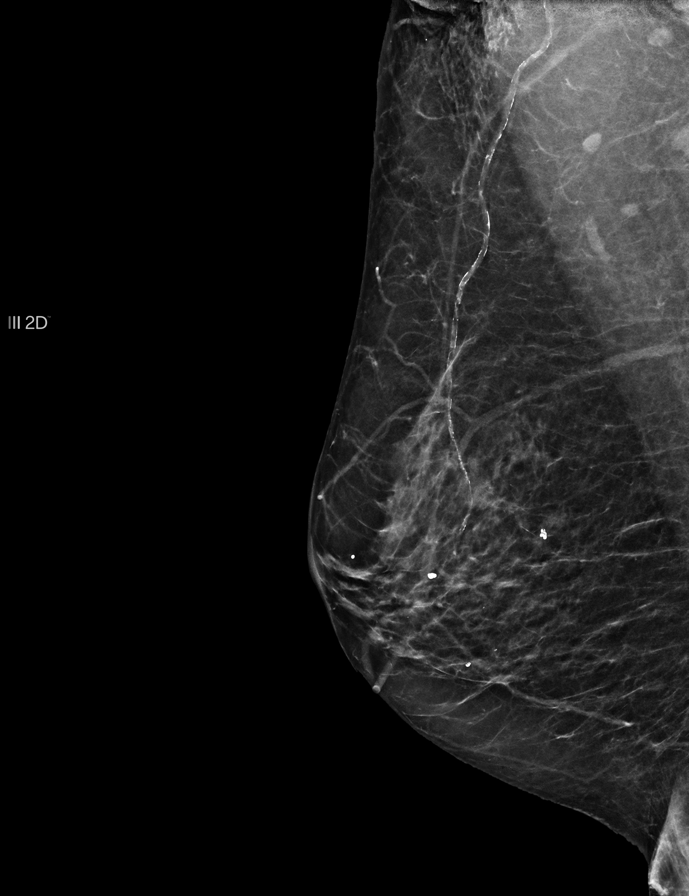

[R MLO tomo · tomo slice 26/51.0]
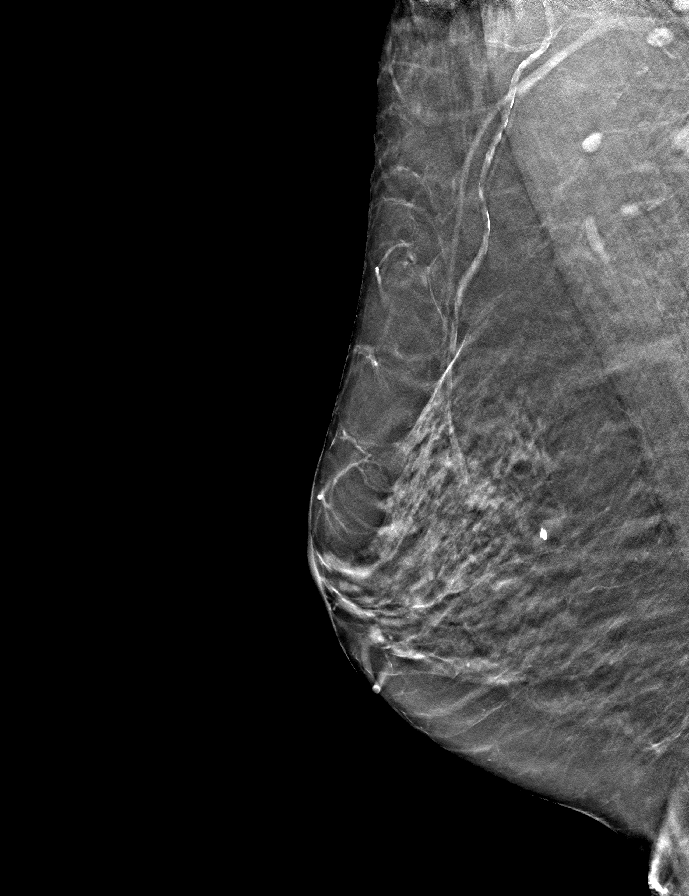

[L MLO tomo · tomo slice 28/55.0]
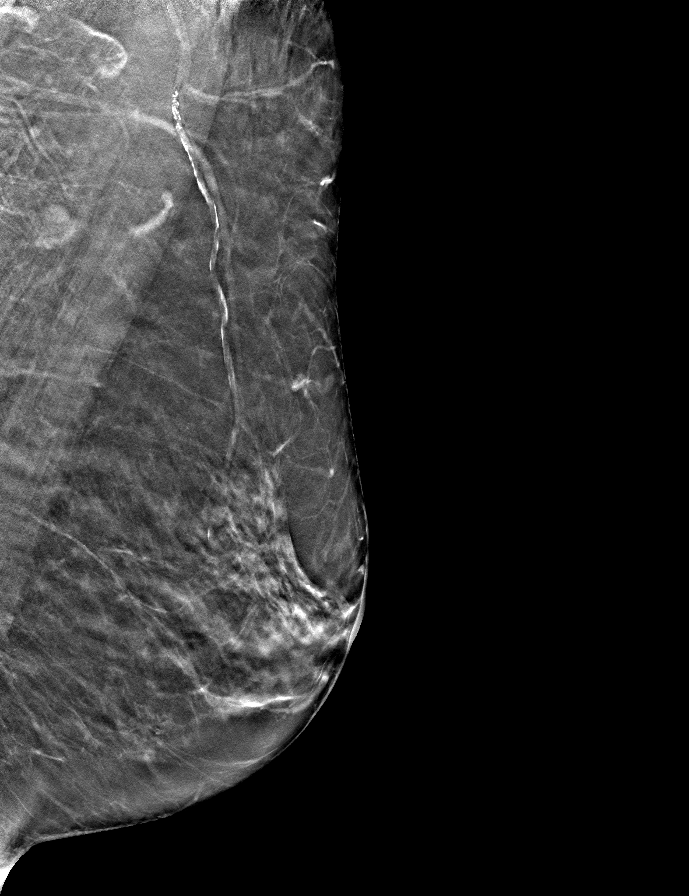

[L CC tomo · tomo slice 26/51.0]
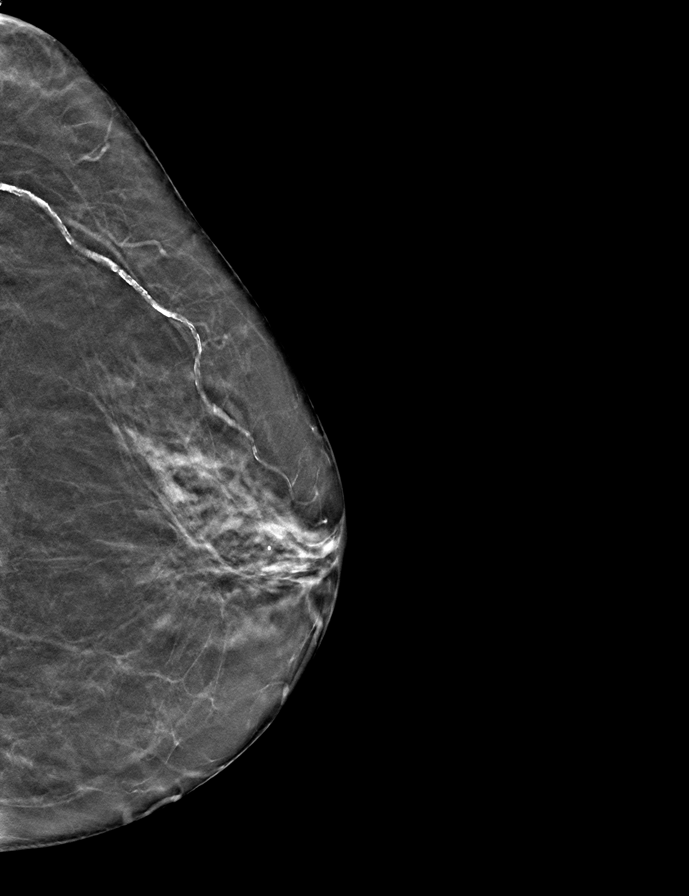

[R CC tomo · tomo slice 25/49.0]
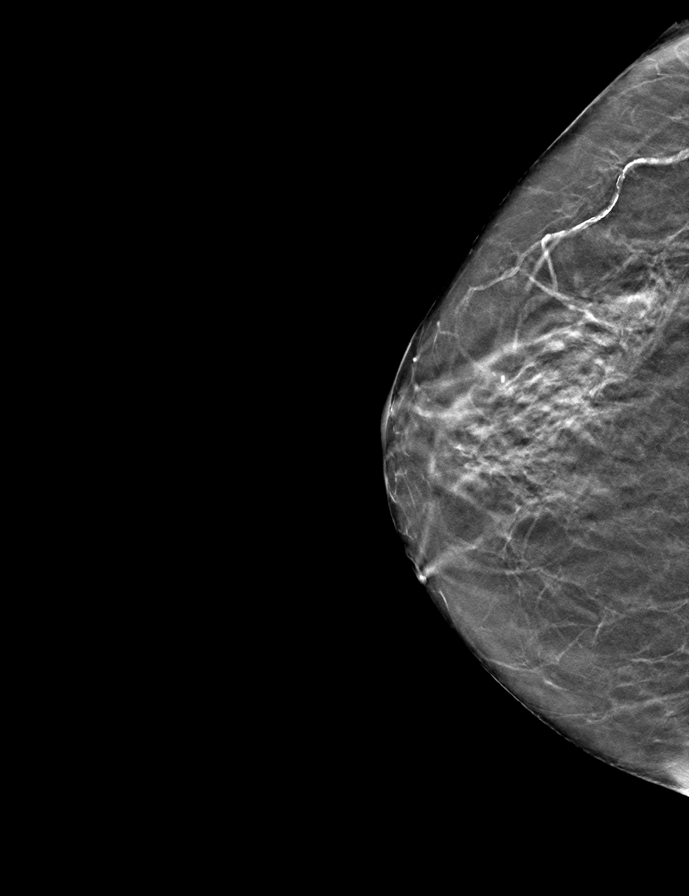

[8 of 24 positions shown; findings below may reference images not displayed]

FINDINGS: Mammogram: Overall stable mammographic appearance. No suspicious mass, 
calcifications or area of architectural distortion. No mammographic findings 
suggestive for malignancy. 
Ultrasound: Ultrasound was performed at area of pain in the left breast. The 
left axilla and the left breast was imaged under ultrasonographic guidance, 
particularly at the [DATE] position, 14 cm from the nipple. No mass, cyst or area 
of parenchymal concern. No ultrasonographic findings suggestive for malignancy.
IMPRESSION: (BI-RADS 2) Benign findings. Routine mammographic follow-up is recommended.

## 2022-08-04 IMAGING — MR MRI CERVICAL SPINE WITHOUT CONTRAST
4 of 6 series · 22 of 48 positions shown · IV contrast (gadolinium)
Comparison: MRI cervical spine November 13, 2021.

________________________________________________________________________________________________ 
MRI CERVICAL SPINE WITHOUT CONTRAST, 08/04/2022 [DATE]: 
CLINICAL INDICATION: Neck pain. Left arm numbness.
TECHNIQUE: Multiplanar, multiecho position MR images of the cervical spine were 
performed without intravenous gadolinium enhancement. Patient was scanned on a 
1.5T magnet.

[Series 101: survey* · axial · 10.0mm · 1.56mm/px · z∈[-30,+199]mm · 8 of 15 slices shown]
[im 1/15]
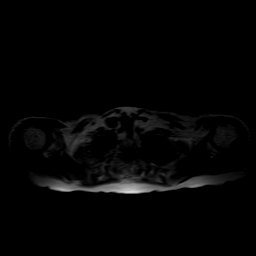
[im 3/15]
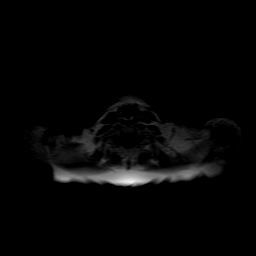
[im 5/15]
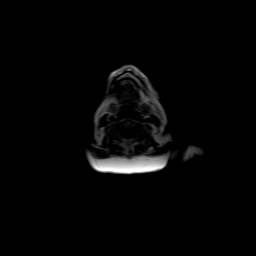
[im 7/15]
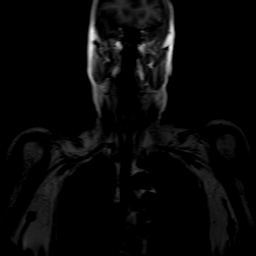
[im 9/15]
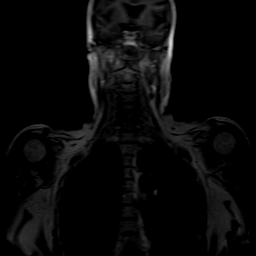
[im 11/15]
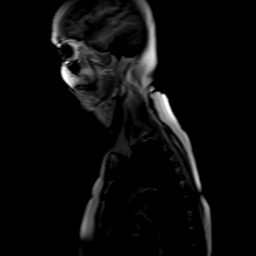
[im 13/15]
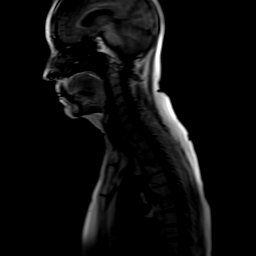
[im 15/15]
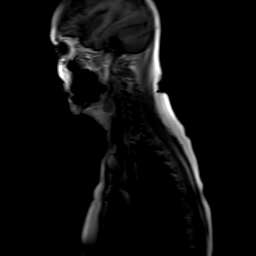

[Series 201: t2w_cor-surv · coronal · 5.0mm · 0.85mm/px · 3 of 7 slices shown]
[im 1/7]
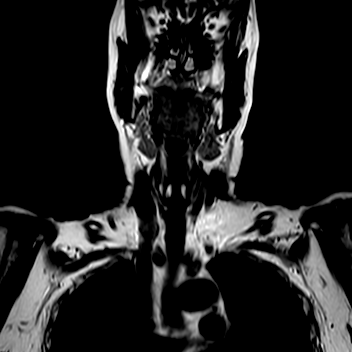
[im 4/7]
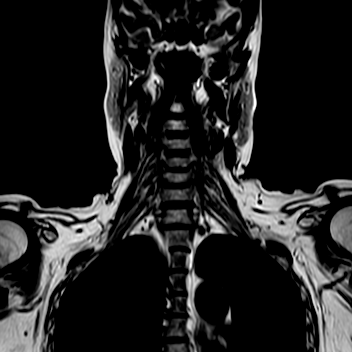
[im 7/7]
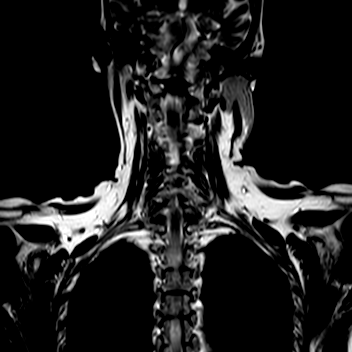

[Series 301: t1_sag · sagittal · 3.0mm · 0.36mm/px · 7 of 15 slices shown]
[im 1/15]
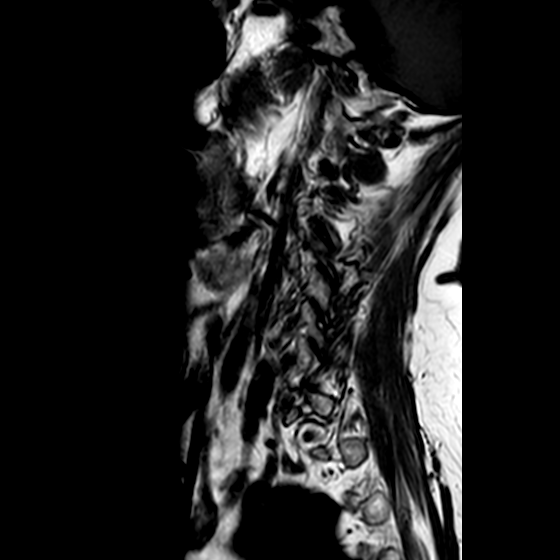
[im 3/15]
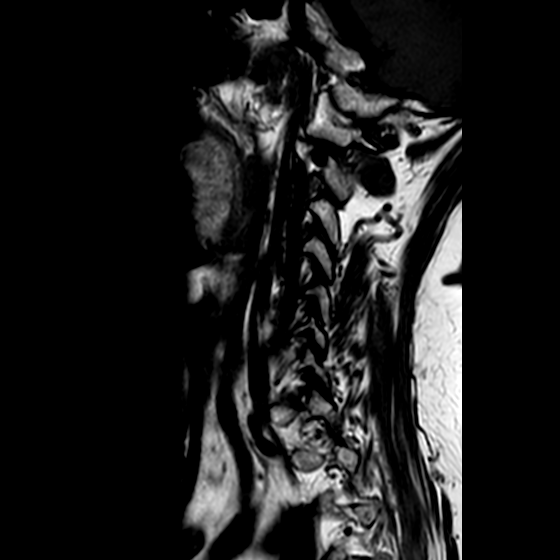
[im 5/15]
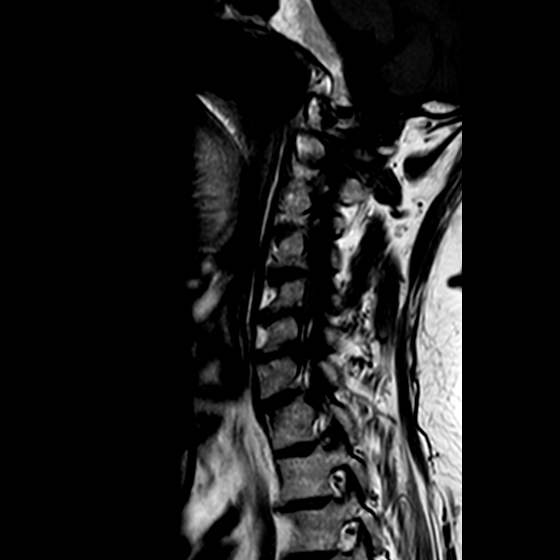
[im 8/15]
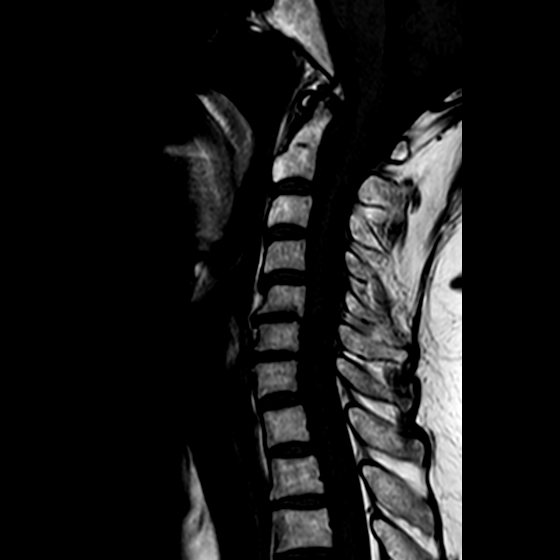
[im 10/15]
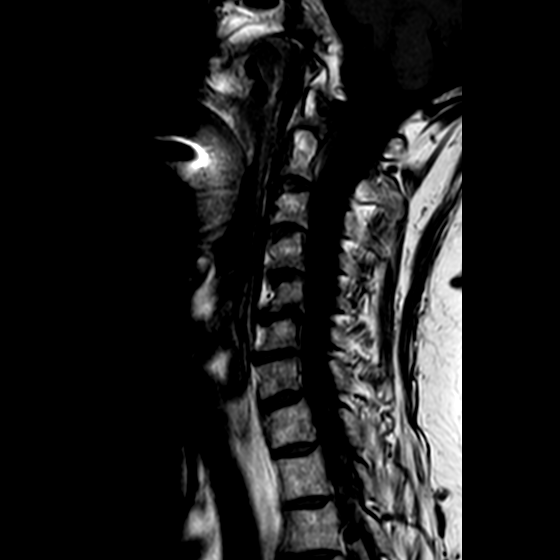
[im 12/15]
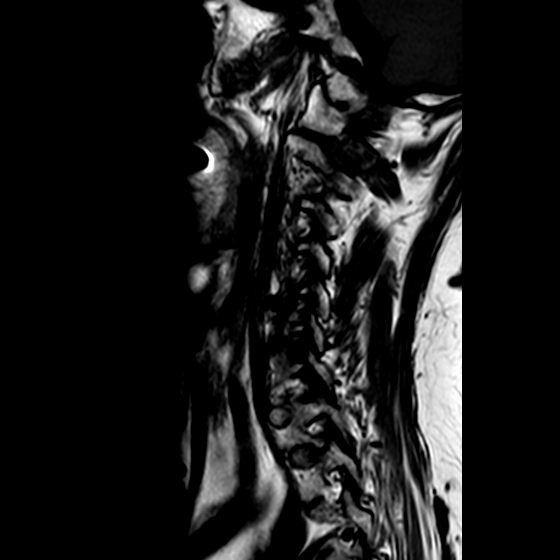
[im 15/15]
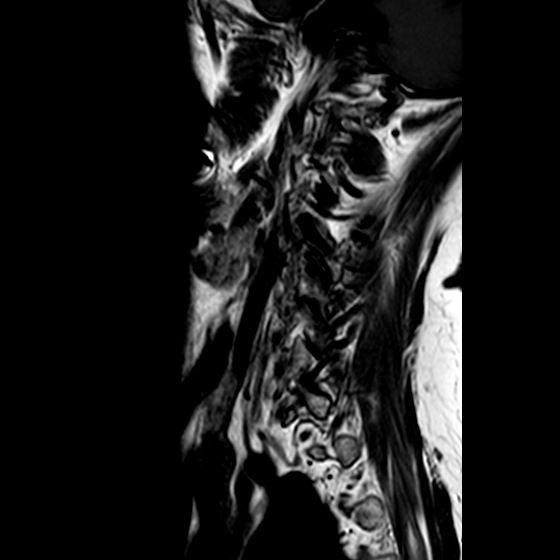

[Series 401: t2w_mv_xd_sag · sagittal · 3.0mm · 0.31mm/px · 4 of 15 slices shown]
[im 1/15]
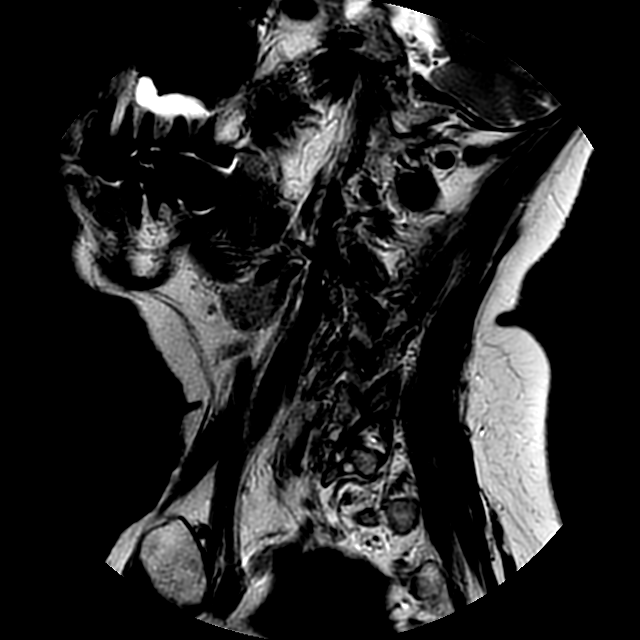
[im 3/15]
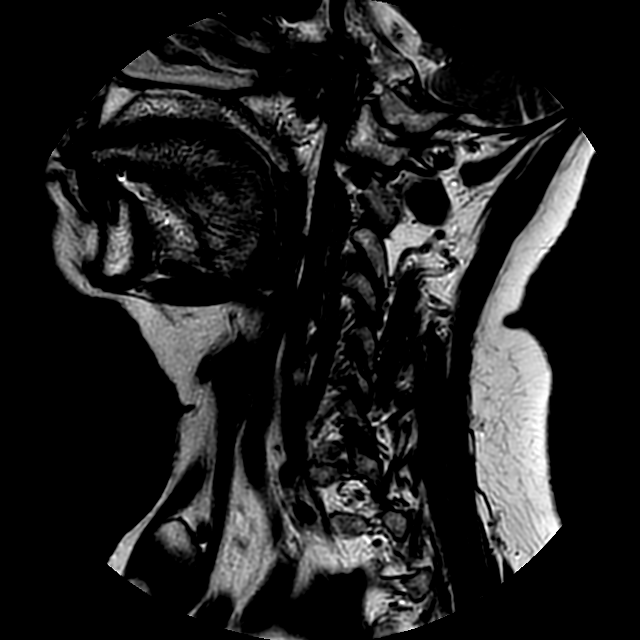
[im 8/15]
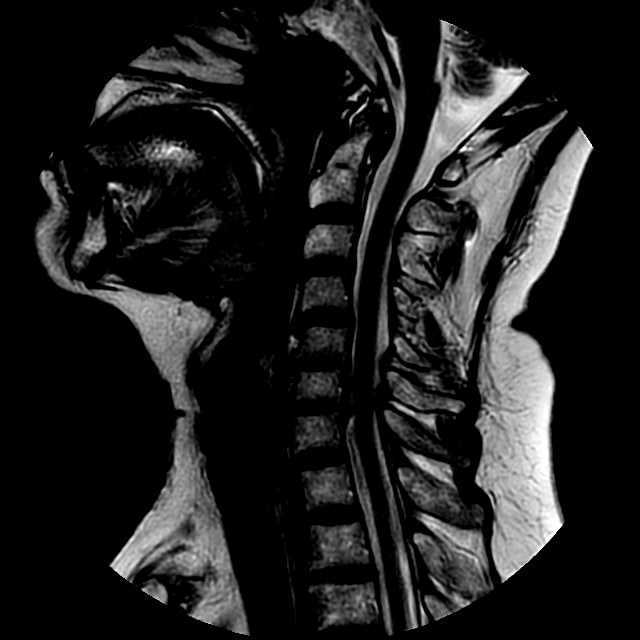
[im 12/15]
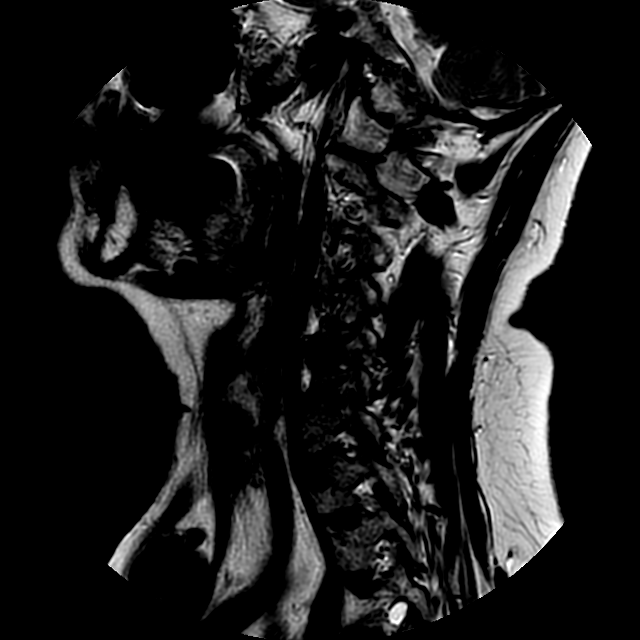

[22 of 48 positions shown; findings below may reference images not displayed]

FINDINGS: -------------------------------------------------------------------------------- 
----------------- 
GENERAL: 
ALIGNMENT: Slight loss of the normal cervical lordosis. Straightening of the 
cervical alignment. 1 mm retrolisthesis of C5 on C6. 1 mm anterolisthesis C7 on 
T1. Anatomic alignment. 
VERTEBRAL BODY HEIGHT: Normal.  
MARROW SIGNAL: No focal suspect signal abnormality. Again noted is minimal edema 
at the base of the dens. There is also minimal edema like signal that insinuates 
between the lateral masses C1-C2 level. 
CORD SIGNAL: Normal.  
ADDITIONAL FINDINGS: None. 
-------------------------------------------------------------------------------- 
---------------- 
SEGMENTAL: 
CRANIOCERVICAL JUNCTION: No significant stenosis. Mild pannus posterior to the 
tip of the dens. 
C2-C3: Normal disc height. No herniation. Normal facets. No spinal canal or 
neural foraminal stenosis. Stable. 
C3-C4: Normal disc height. No herniation. Normal facets. No spinal canal or 
neural foraminal stenosis. Stable. 
C4-C5: Minimal loss of disc height. Canal and foramina are patent. Mild 
right-sided facet arthropathy. Stable. 
C5-C6: Moderate loss of disc height. Minimal generalized disc osteophyte 
complex. Posterior ligament hypertrophy. Mild canal stenosis. Right foramen 
patent. Moderate to severe left foraminal narrowing. Mild facet arthropathy. 
Stable. 
C6-C7: Moderate loss of disc height. Generalized disc osteophyte complex 
eccentric to the left with moderate canal stenosis and specific narrowing left 
lateral recess. Severe left foraminal narrowing. Mild-to-moderate right 
foraminal narrowing. Mild facet arthropathy. Stable. 
C7-T1: Normal disc height. No herniation. Normal facets. No spinal canal or 
neural foraminal stenosis. 
-------------------------------------------------------------------------------- 
---------------
IMPRESSION: C5-C6, moderate to severe left foraminal narrowing, stable. 
C6-C7, moderate canal stenosis with specific narrowing left lateral recess. 
Severe left foraminal narrowing. Stable. 
There remains minimal edema at the base of the dens, likely reactive. 
Other less significant degenerative changes as above.

## 2022-08-30 IMAGING — CT CTA CHEST
2 of 5 series · 15 of 46 positions shown, 17 images · IV contrast (isovue)
Comparison: None

________________________________________________________________________________________________ 
CTA CHEST, 08/30/2022 [DATE]: 
CLINICAL INDICATION: Aortitis. Chest pain. 
A search for DICOM formatted images was conducted for prior CT imaging studies 
completed at a non-affiliated media free facility.
TECHNIQUE: The chest was scanned from base of neck through the lung bases with 
100 mL of Isovue 370  injected intravenously on a high resolution low dose CT 
scanner using dose reduction techniques.  Routine MPR and MIP 3D renderings were 
reconstructed on an independent workstation with concurrent physician 
supervision. The patients eGFR was calculated to be 53.8 mL/min/1.73 m2 using 
the i-STAT device.

[Series 6: axial vitrea · axial · 0.58mm/px · z∈[-297,-32]mm · 12 of 297 slices shown, 14 images]
[im 16/297  soft-tissue]
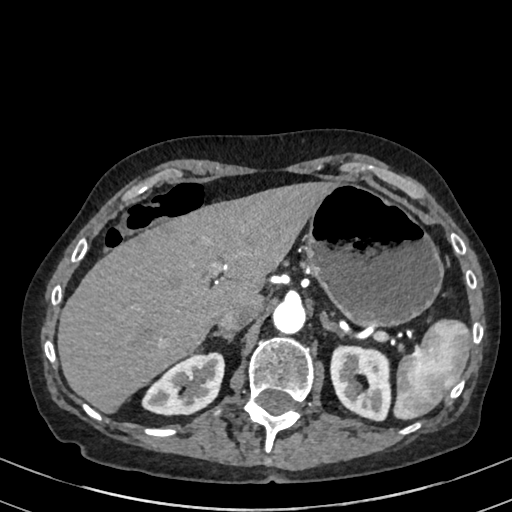
[im 16/297  bone]
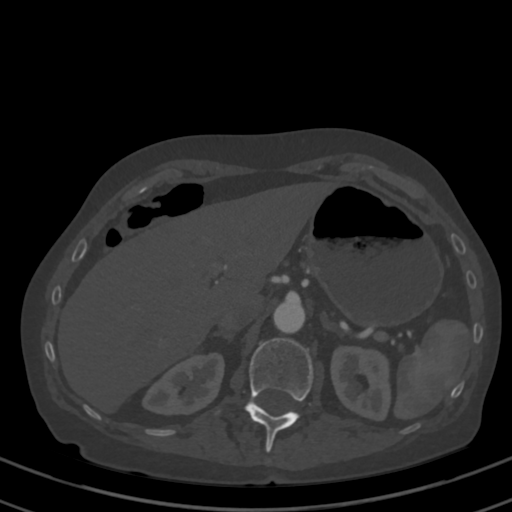
[im 47/297  soft-tissue]
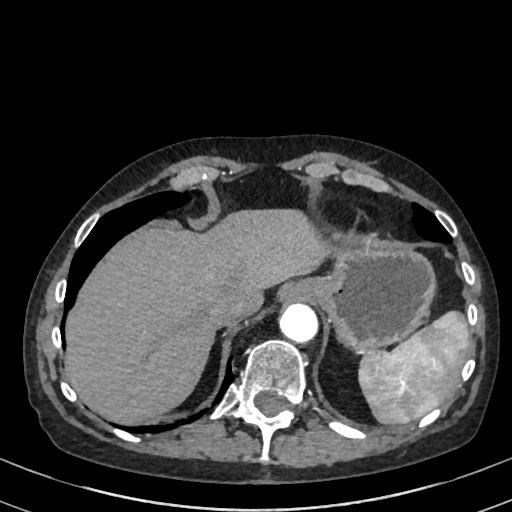
[im 63/297  soft-tissue]
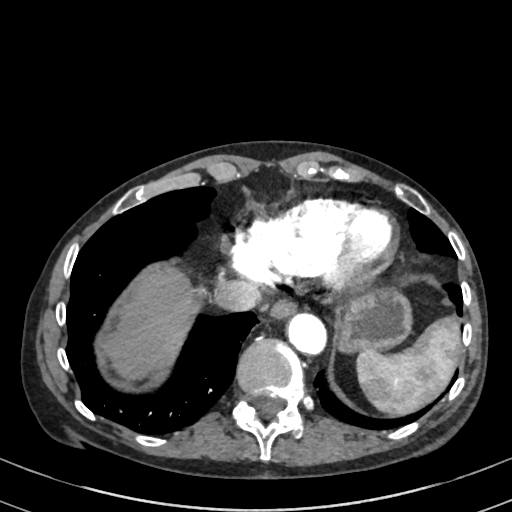
[im 94/297  soft-tissue]
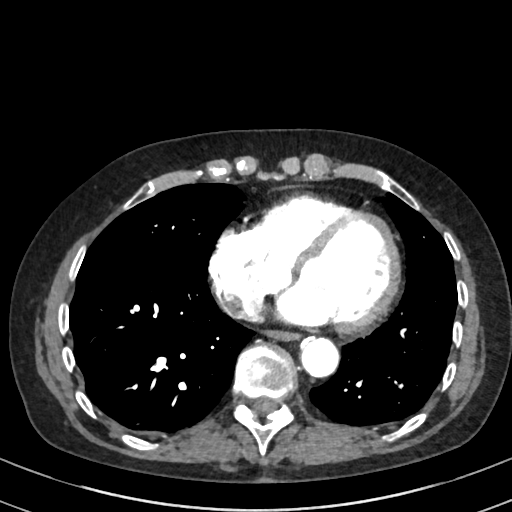
[im 110/297  soft-tissue]
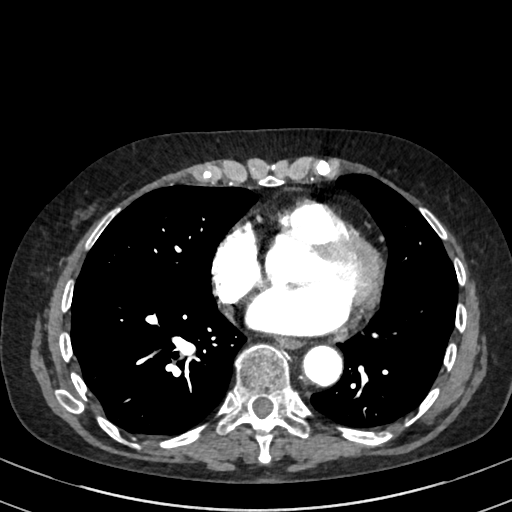
[im 141/297  soft-tissue]
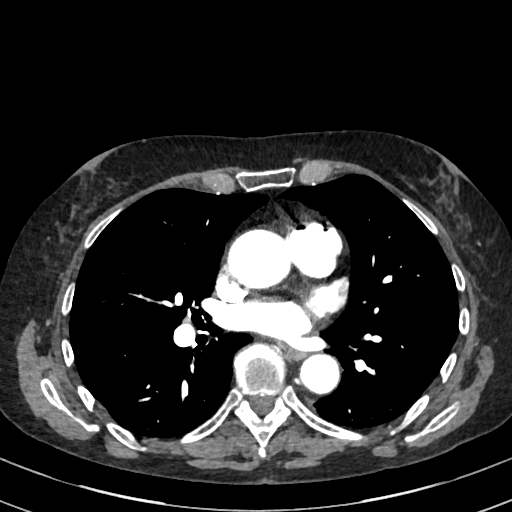
[im 156/297  soft-tissue]
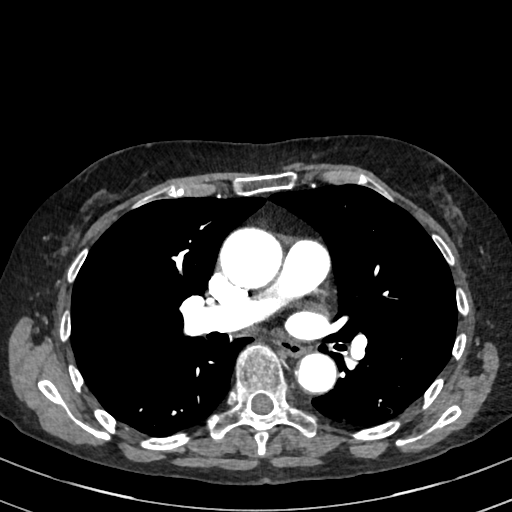
[im 187/297  soft-tissue]
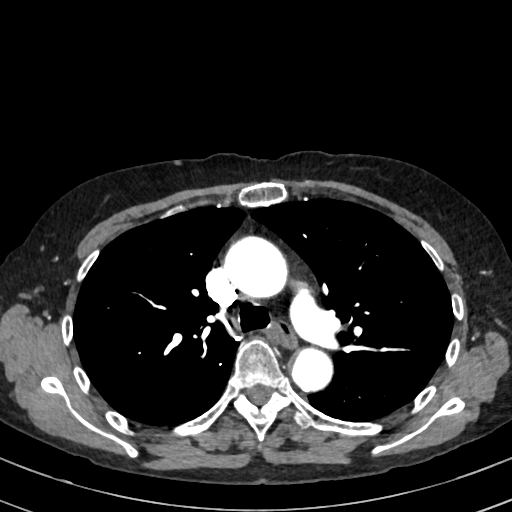
[im 203/297  soft-tissue]
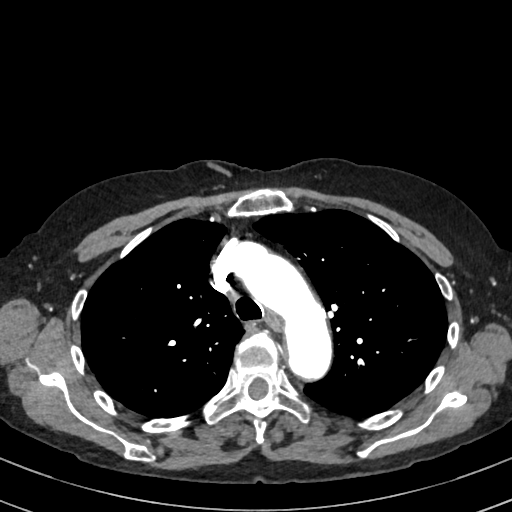
[im 203/297  bone]
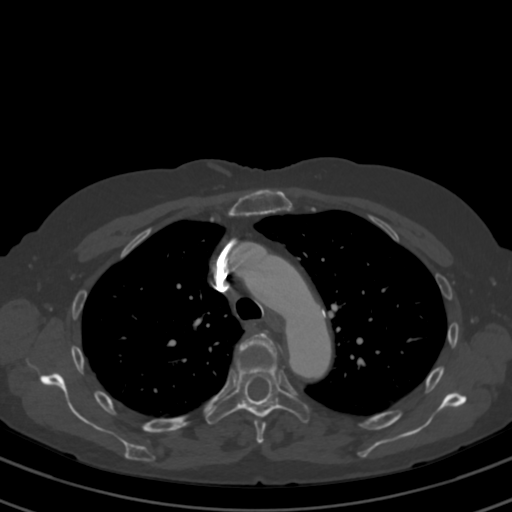
[im 234/297  soft-tissue]
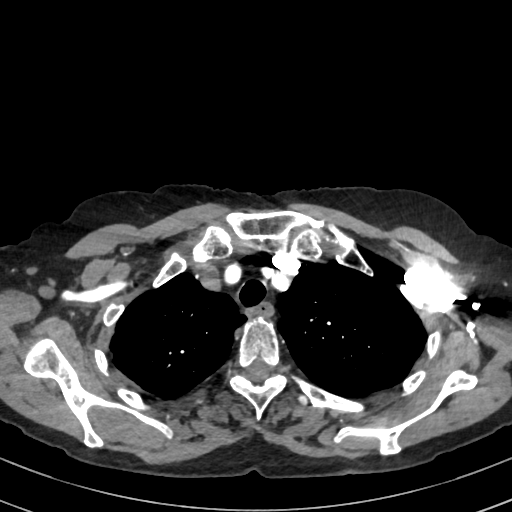
[im 250/297  soft-tissue]
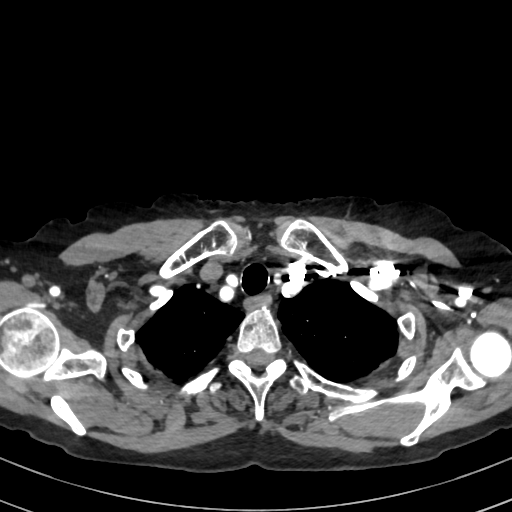
[im 281/297  soft-tissue]
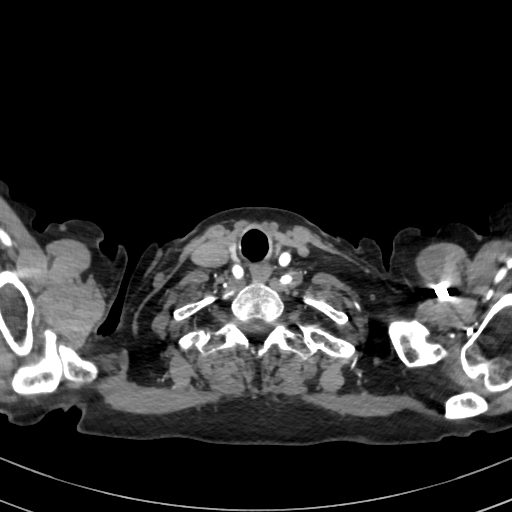

[Series 7: cor · coronal · 0.59mm/px · 3 of 95 slices shown]
[im 32/95  soft-tissue]
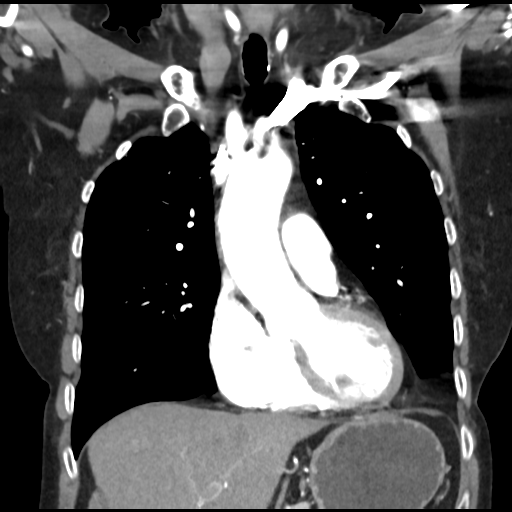
[im 42/95  soft-tissue]
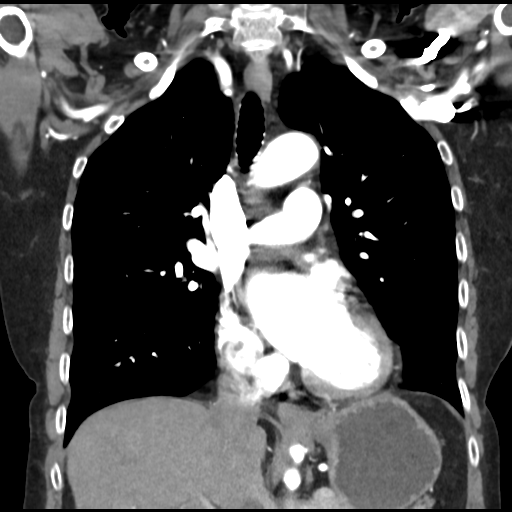
[im 53/95  soft-tissue]
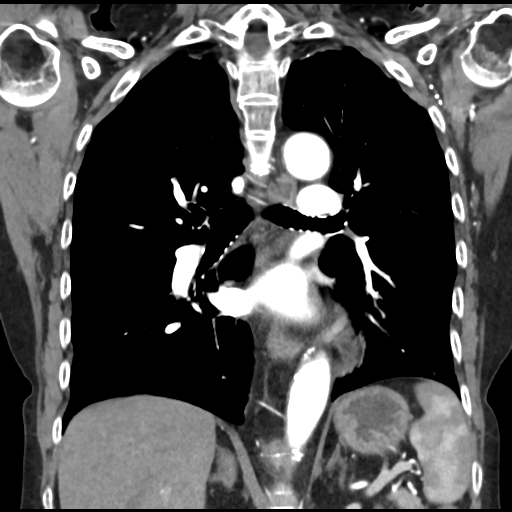

[15 of 46 positions shown; findings below may reference images not displayed]

FINDINGS: PULMONARY ARTERIES: No pulmonary embolus. 
LUNGS AND PLEURA: Pleural based nodule right middle lobe measuring 6 mm x 3 mm 
as seen on axial image #88 of series #4. Minor dependent atelectatic changes in 
the lower lobes with no infiltrates or consolidations. No effusions.   
MEDIASTINUM: No masses.  
LYMPH NODES: No adenopathy. 
HEART: Normal in size.  No pericardial effusion. Moderate coronary 
calcifications. 
AORTA AND GREAT VESSELS: No aneurysm or dissection. Ascending thoracic aorta 
measures up to 3.3 cm with no thoracic aortic aneurysm. Mild atherosclerotic 
changes of the transverse and ascending thoracic aorta calcified and 
noncalcified plaquing without ulcerated plaque. No inflammatory changes seen 
surrounding the thoracic aorta. 
OSSEOUS STRUCTURES: No acute fracture or destructive lesion.   
UPPER ABDOMEN: Portions of the abdomen included are normal.
IMPRESSION: No thoracic aortic aneurysm or dissection with mild calcified and noncalcified 
plaquing of the descending and transverse thoracic aorta without associated 
inflammatory changes. 
No pulmonary embolus. 
6 mm x 3 mm pleural-based nodular density in the right middle lobe, this can be 
followed with CT of the chest in one year as per reference below. 
REFERENCE: Recommendations for pulmonary nodule follow-up according to 
[HOSPITAL] Guidelines. 
Solitary nodule size: < 6 mm 
*Low-risk patients: No followup needed. 
*High-risk patients: Optional CT at 12 months. 
Solitary Nodule size: 6-8 mm 
*Low-risk patients: Followup at 6-12 months, then consider further follow-up at 
18-24 months. 
*High-risk patients: Initial followup CT at 6-12 months and then at 18-24 months 
if no change. 
Solitary Nodule size: >8 mm 
*Either low or high risk: 
*Consider follow-up CT at 3 months, and/or CT-PET, and/or biopsy. 
NOTE: 
Low risk patients: minimal or absent history of smoking and or other known risk 
factors. 
High risk patients: history of smoking or of other known risk factors (e.g. 
first degree relative with lung cancer, or exposure to asbestos, radon uranium) 
If a nodule up to 8mm is partly solid or is ground glass further follow up is 
required after 24 months to exclude possible slow growing adenocarcinoma (METEKIA) 
Size is average of length and width. 
RADIATION DOSE REDUCTION: All CT scans are performed using radiation dose 
reduction techniques, when applicable.  Technical factors are evaluated and 
adjusted to ensure appropriate moderation of exposure.  Automated dose 
management technology is applied to adjust the radiation doses to minimize 
exposure while achieving diagnostic quality images.

## 2022-10-12 IMAGING — MR MRI PITUITARY W/WO CONTRAST
10 of 13 series · 27 of 48 positions shown · IV contrast (gadavist)
Comparison: Pituitary MRI October 24, 2021

________________________________________________________________________________________________ 
MRI PITUITARY W/WO CONTRAST, 10/12/2022 [DATE]: 
CLINICAL INDICATION: Aneurysm, pituitary adenoma, decreasing vision
TECHNIQUE: Thin section sagittal and coronal T1, coronal T2 images through the 
sella were acquired prior to contrast. Axial FLAIR images of the entire brain 
also obtained. After the intravenous administration of with 5.5 mL of Gadavist 
were injected intravenously. 2.0 mL of Gadavist was discarded. Dynamic enhanced 
coronal images through the sella were obtained as well as static thin section 
sagittal and coronal T1 images through the sella. Subsequently, enhanced axial 
fat suppressed T1 sequences through the entire brain were acquired. . Patient 
was scanned on a

[Series 102: mpr - smartbrain · axial · 1.1mm · 1.09mm/px · 1 of 2 slices shown]
[im 1/2]
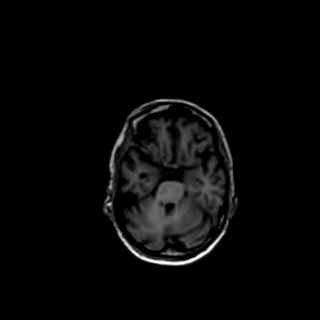

[Series 203: dadc map · axial · 5.0mm · 1.03mm/px · z∈[-83,+64]mm · 2 of 27 slices shown]
[im 1/27]
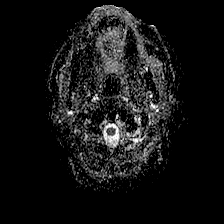
[im 27/27]
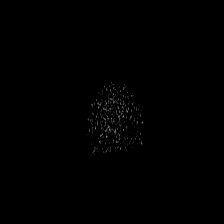

[Series 204: (id) · axial · 5.0mm · 1.03mm/px · z∈[-83,+64]mm · 3 of 27 slices shown]
[im 1/27]
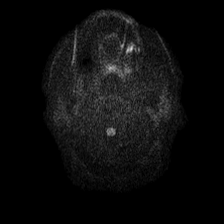
[im 14/27]
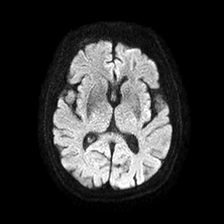
[im 27/27]
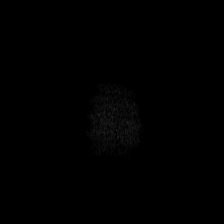

[Series 301: flair_ax +fs · axial · 5.0mm · 0.49mm/px · z∈[-91,+54]mm · 3 of 27 slices shown]
[im 1/27]
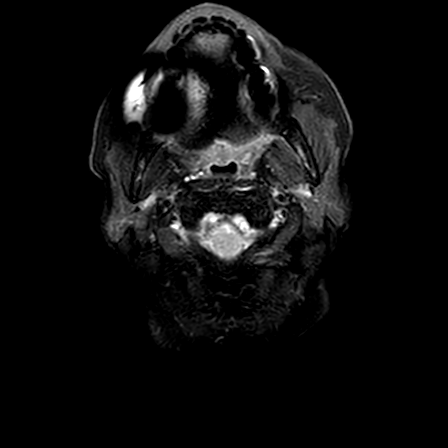
[im 14/27]
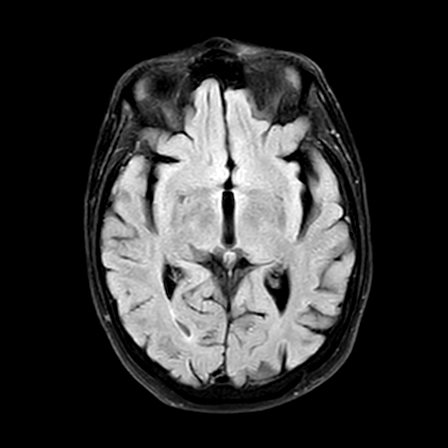
[im 27/27]
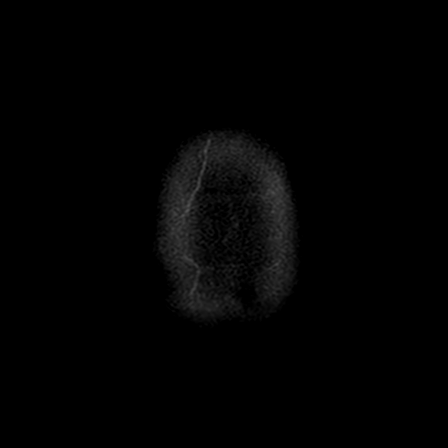

[Series 402: swip · axial · 10.0mm · 0.36mm/px · z∈[-74,+56]mm · 8 of 140 slices shown]
[im 1/140]
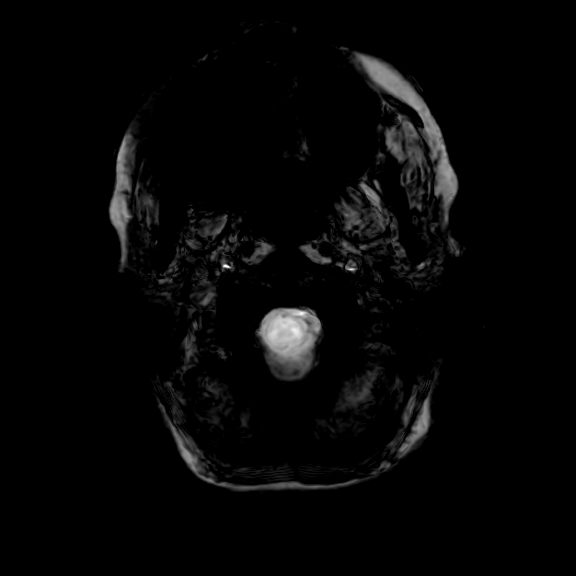
[im 22/140]
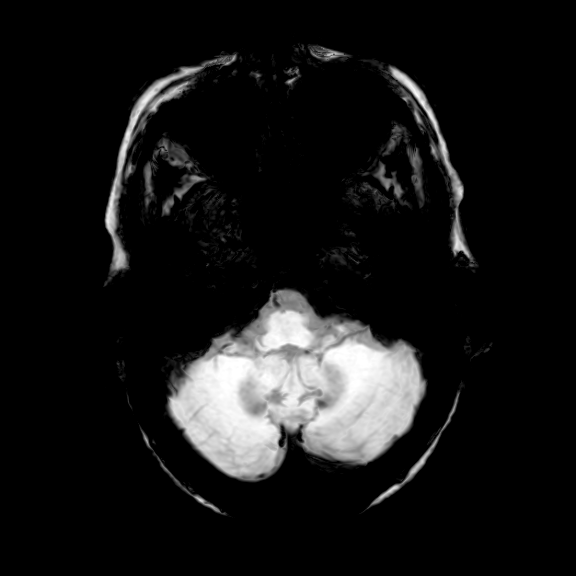
[im 43/140]
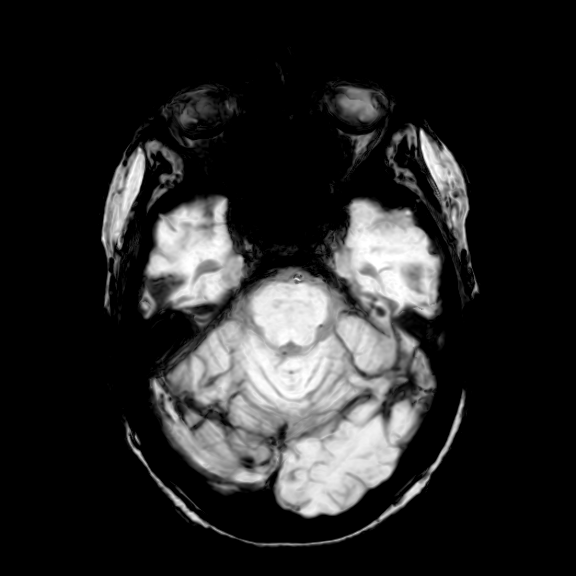
[im 65/140]
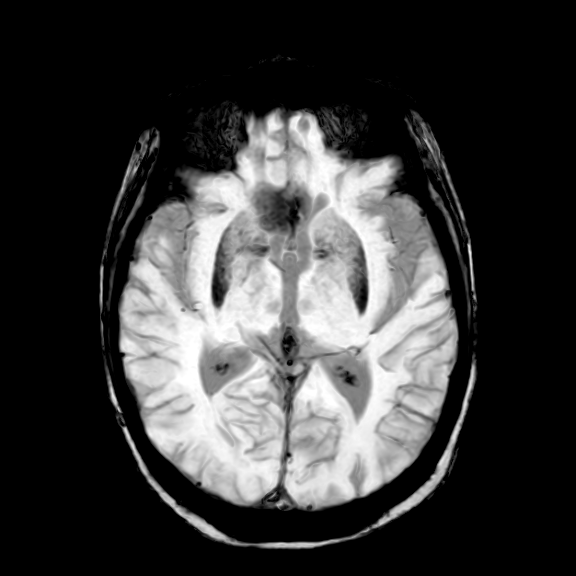
[im 75/140]
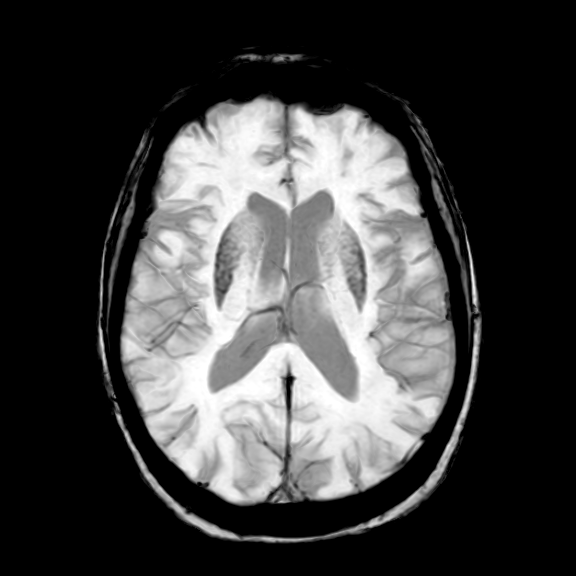
[im 97/140]
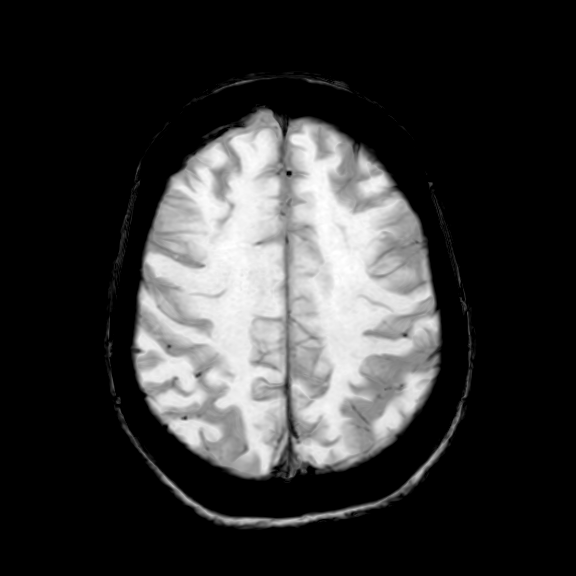
[im 118/140]
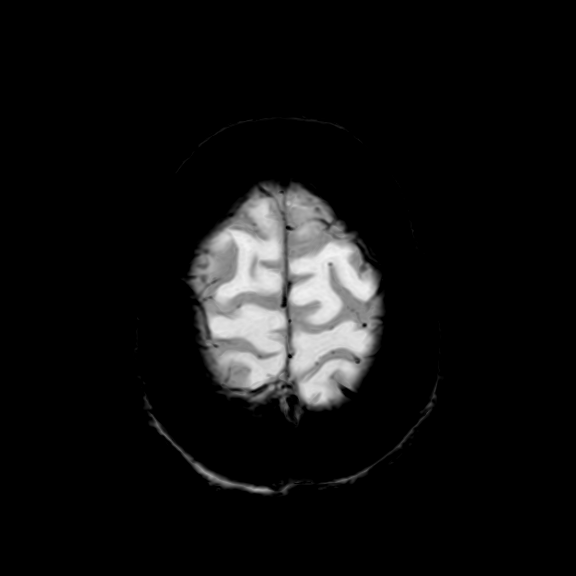
[im 140/140]
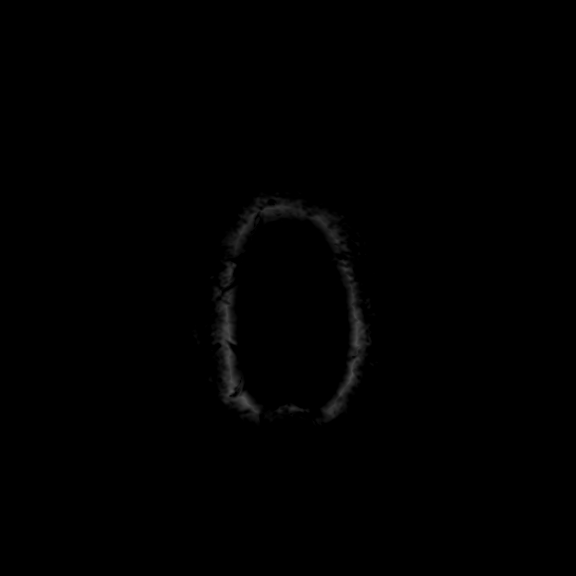

[Series 501: t2_ax · axial · 5.0mm · 0.43mm/px · z∈[-91,+54]mm · 3 of 27 slices shown]
[im 1/27]
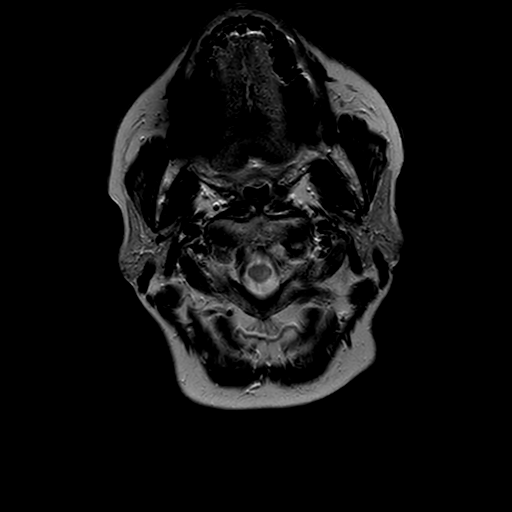
[im 14/27]
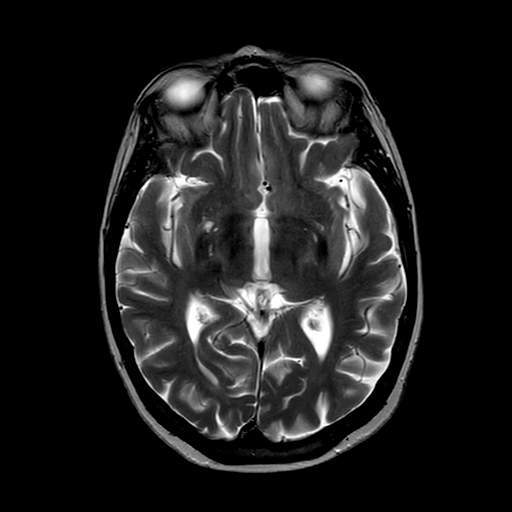
[im 27/27]
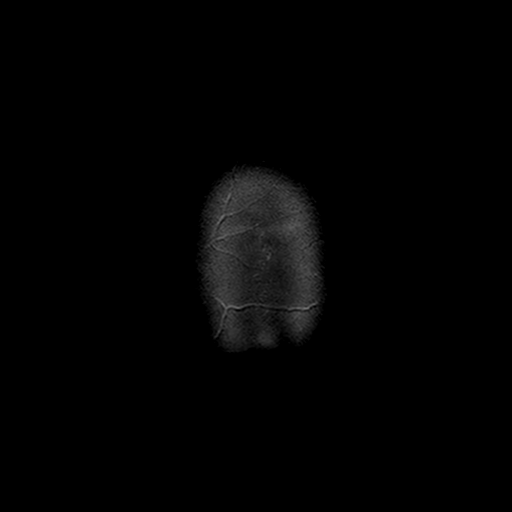

[Series 601: t1_sag_pit · sagittal · 2.0mm · 0.47mm/px · 1 of 13 slices shown]
[im 1/13]
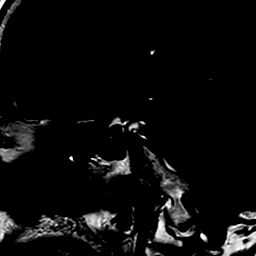

[Series 701: t1_cor_pit · coronal · 2.5mm · 0.47mm/px · 1 of 15 slices shown]
[im 1/15]
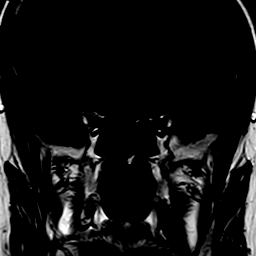

[Series 801: t2_cor_pit · coronal · 2.5mm · 0.38mm/px · 1 of 15 slices shown]
[im 1/15]
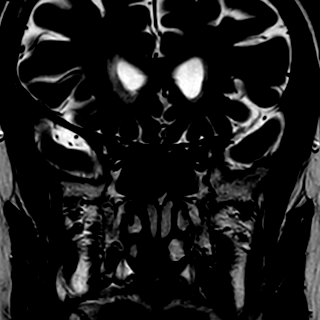

[Series 901: t1_cor_dyn · coronal · 3.0mm · 0.70mm/px · 4 of 144 slices shown]
[im 1/144]
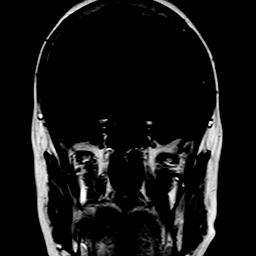
[im 23/144]
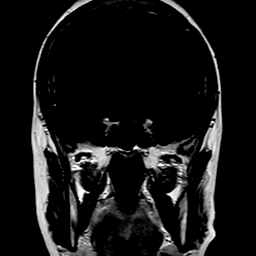
[im 45/144]
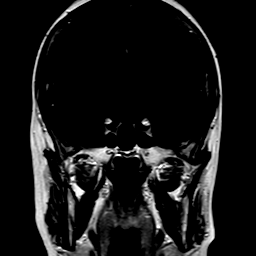
[im 67/144]
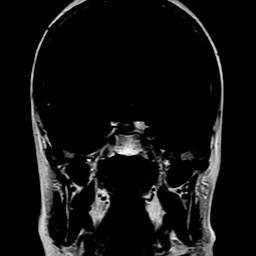

[27 of 48 positions shown; findings below may reference images not displayed]

FINDINGS: There is no change in appearance of the pituitary adenoma, vertical 
height approximately 5 mm. There is no suprasellar extension or chiasmatic 
deformity. The sella is not expanded. There is no evidence for cavernous sinus 
invasion. 
Enhanced images of the brain show no pathologic enhancement or intracranial 
mass. Diffusion-weighted images are negative. There is no hydrocephalus. There 
are sparse T2 hyperintense signal foci in the cerebral white matter, and a 4 mm 
focus in the right pons. 
Major intracranial arterial segments appear open. Dural sinuses are open. There 
is no orbital mass. There has been bilateral ocular lens implantation. 
Small left maxillary retention cyst. Tympanic cavities and mastoid air cells are 
clear. Craniocervical junction is open.
IMPRESSION: No change in 5 mm pituitary adenoma. There is no evidence for suprasellar 
extension or chiasmatic deformity. 
No recent infarct or other intracranial mass. 
Minimal-mild cerebral and pontine microangiopathic foci are not different from 
the prior study. 
No evidence for orbital mass. There has been bilateral ocular lens implantation.

## 2022-12-04 IMAGING — CT CT ABDOMEN AND PELVIS WITH CONTRAST
2 of 3 series · 16 of 46 positions shown, 18 images · IV contrast (agent unspecified)
Comparison: 10/23/2018

________________________________________________________________________________________________ 
CT ABDOMEN AND PELVIS WITH CONTRAST, 12/04/2022 [DATE]: 
A search for DICOM formatted images was conducted for prior CT imaging studies 
completed at a non-affiliated media free facility.   
CLINICAL INDICATION: RIGHT lower quadrant pain with occasional nausea lasting 2 
weeks, 3 weeks ago, now complaining of sharp LEFT flank pain, previous 
hysterectomy
TECHNIQUE: The abdomen and pelvis was scanned from lung bases through the pubic 
rami with 75 mL of on a high-resolution CT scanner using dose reduction 
techniques.  Routine MPR reconstructions were performed. The patients eGFR was 
calculated to be 69.4 mL/min/1.73 m2 using the i-STAT device..

[Series 4: abd/pel ax w · axial · 0.71mm/px · z∈[-604,-256]mm · 13 of 134 slices shown, 15 images]
[im 9/134  soft-tissue]
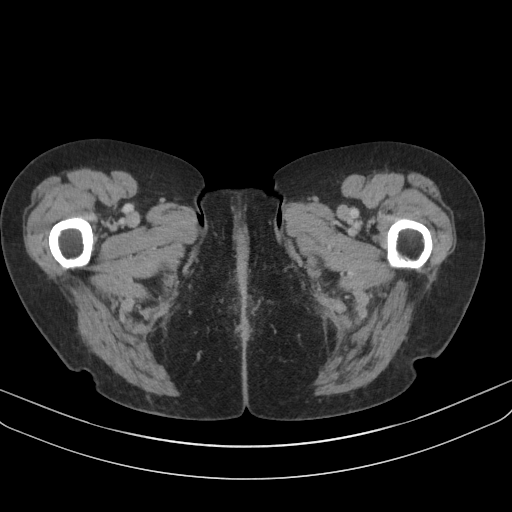
[im 9/134  bone]
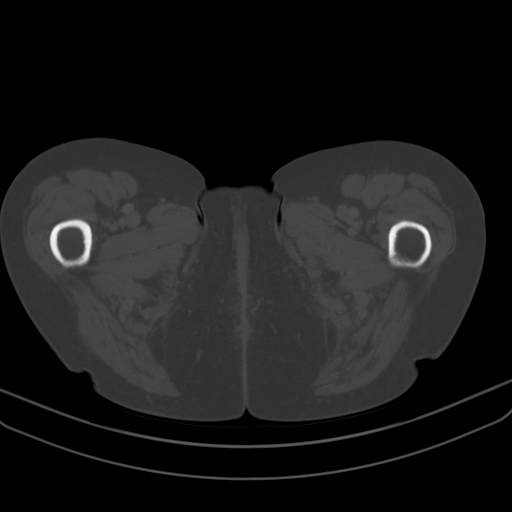
[im 18/134  soft-tissue]
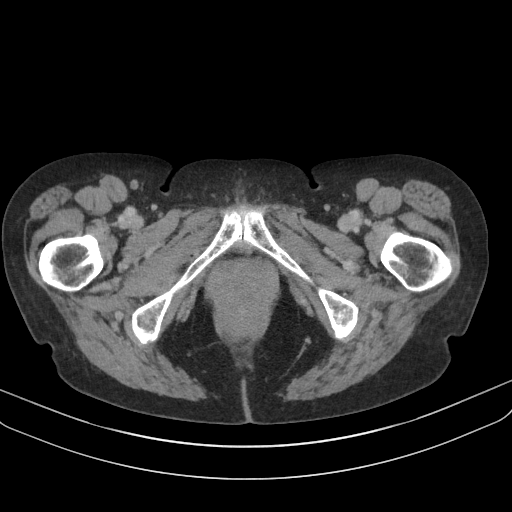
[im 26/134  soft-tissue]
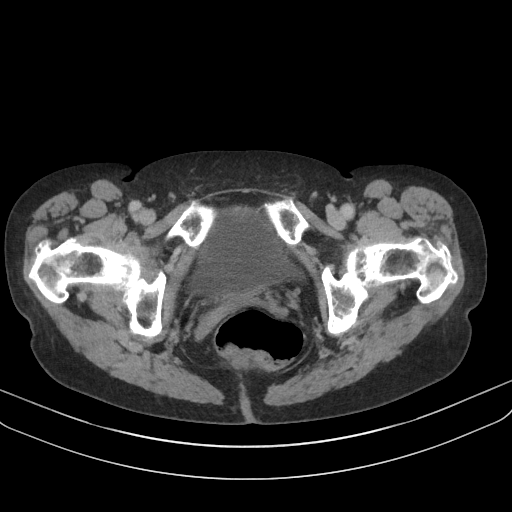
[im 39/134  soft-tissue]
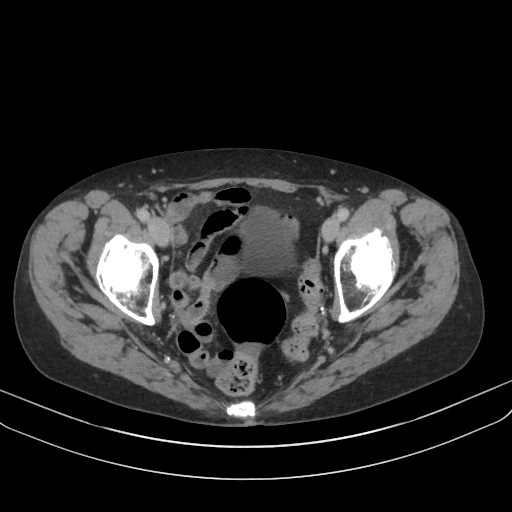
[im 48/134  soft-tissue]
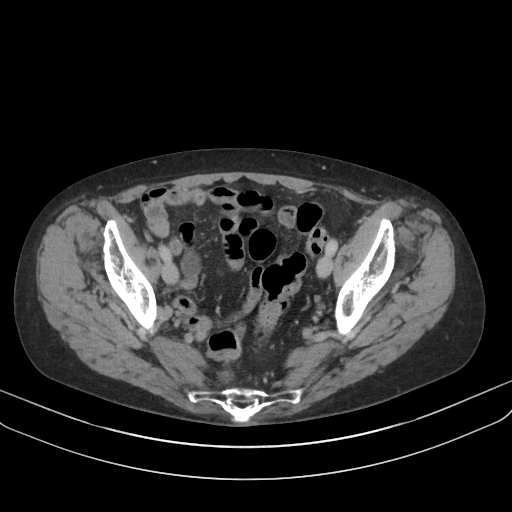
[im 56/134  soft-tissue]
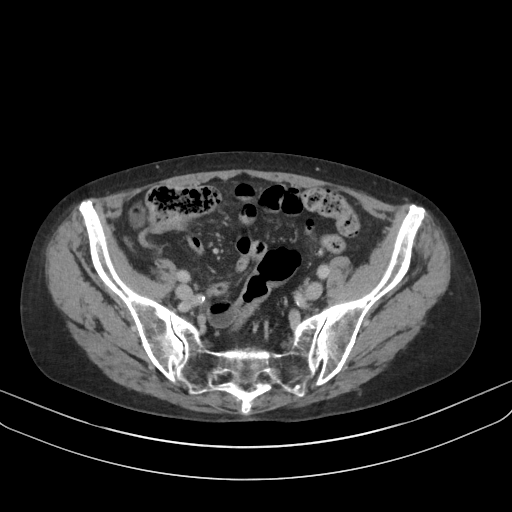
[im 69/134  soft-tissue]
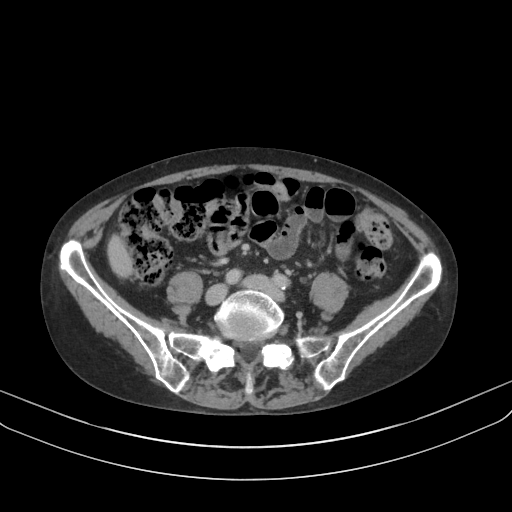
[im 78/134  soft-tissue]
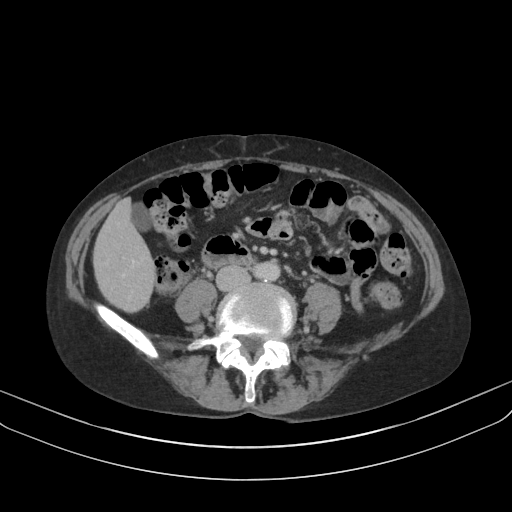
[im 86/134  soft-tissue]
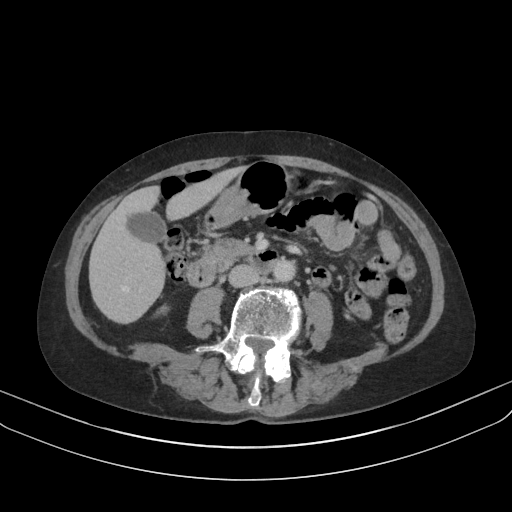
[im 86/134  bone]
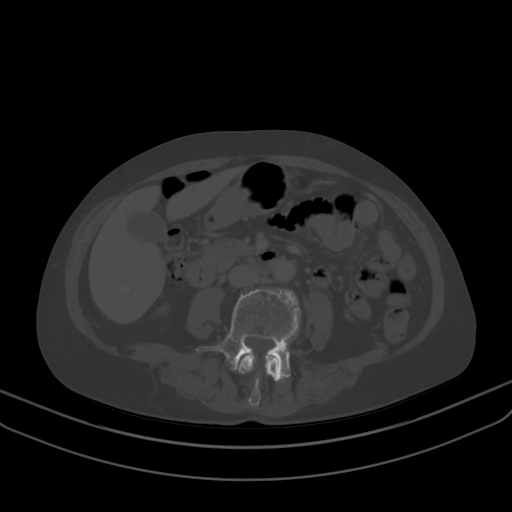
[im 95/134  soft-tissue]
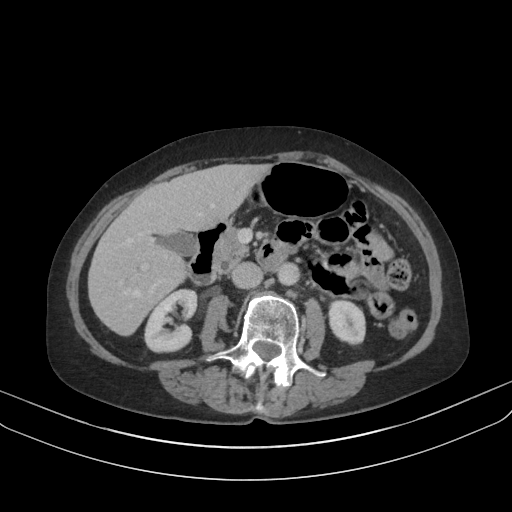
[im 108/134  soft-tissue]
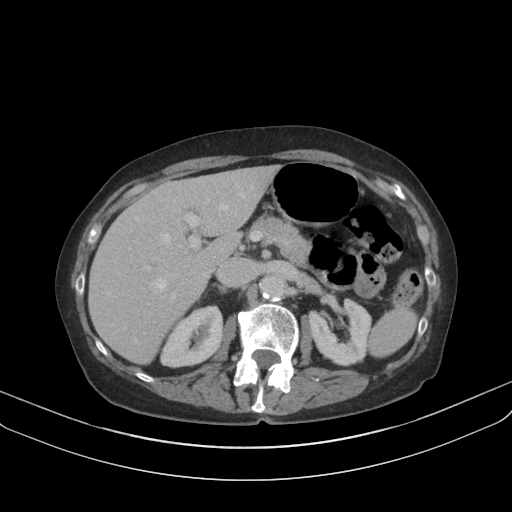
[im 116/134  soft-tissue]
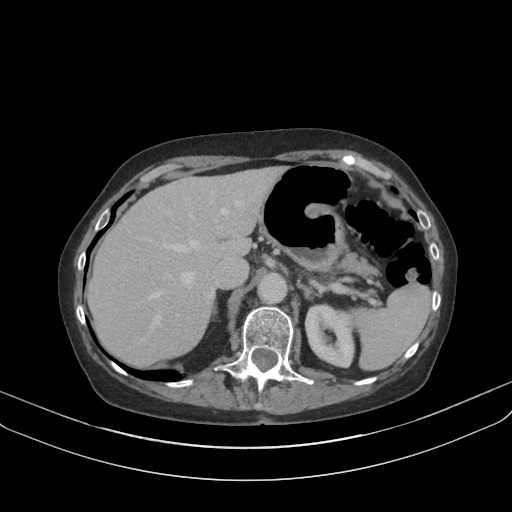
[im 125/134  soft-tissue]
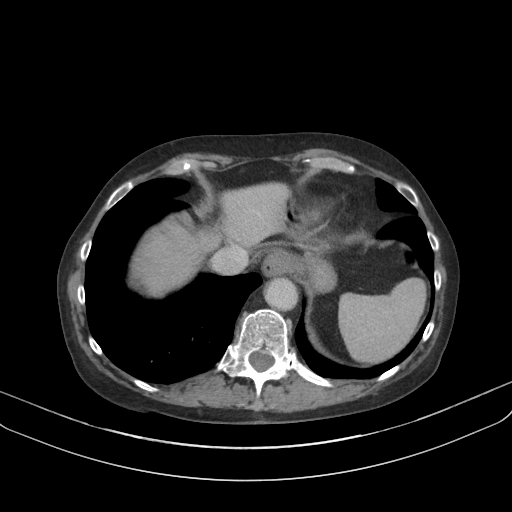

[Series 5: cor from thins · coronal · 0.77mm/px · 3 of 109 slices shown]
[im 37/109  soft-tissue]
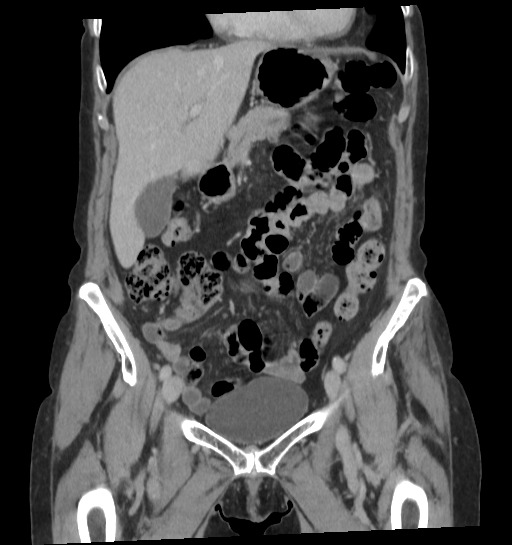
[im 49/109  soft-tissue]
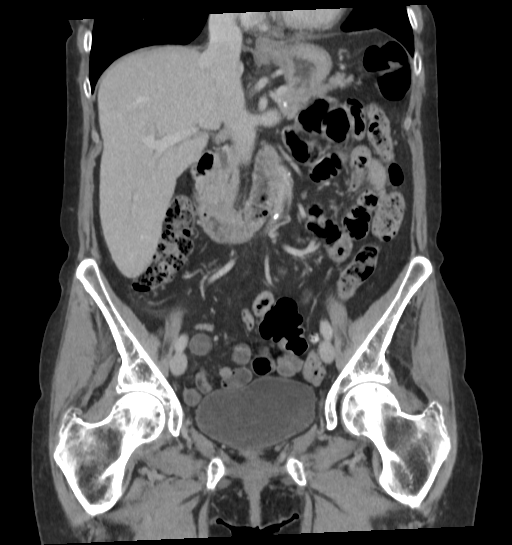
[im 61/109  soft-tissue]
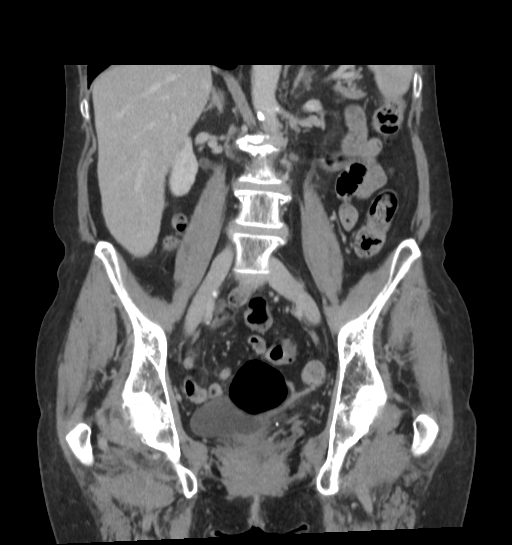

[16 of 46 positions shown; findings below may reference images not displayed]

FINDINGS: LUNG BASES: Clear 
HEPATOBILIARY: Mild fatty infiltrated unchanged. No radiopaque gallstones or 
biliary ductal dilatation. 
SPLEEN: Normal size. 
PANCREAS: No ductal dilatation or mass.   
ADRENALS: No mass. 
GENITOURINARY: No enhancing mass or hydronephrosis. No definite renal, ureteral 
or bladder stone. Moderately distended urinary bladder. Previous hysterectomy. 
LYMPH NODES: No adenopathy. 
STOMACH, SMALL BOWEL AND COLON: No pathology identified. No obstruction. 
Moderate rectal stool ball. 
VASCULAR STRUCTURES: No aneurysm.  
MUSCULOSKELETAL: Mild lumbar levoscoliosis with degenerative spondylosis, apex 
L2/L3. Left paracentral and lateral broad-based bulges L2/L3, L3/L4, and L4/L5.  
ADDITIONAL FINDINGS: No free fluid or free air.
IMPRESSION: 1.  No definite etiology for LEFT flank pain identified. 
2.  Multilevel disc bulges with canal and foraminal stenosis L2-L5. If pain may 
be neurogenic in origin, recommend MR lumbar. 
3.  No GU stone disease or hydronephrosis. 
4.  Moderate rectal stool ball.  
5.  Mild fatty liver. 
RADIATION DOSE REDUCTION: All CT scans are performed using radiation dose 
reduction techniques, when applicable.  Technical factors are evaluated and 
adjusted to ensure appropriate moderation of exposure.  Automated dose 
management technology is applied to adjust the radiation doses to minimize 
exposure while achieving diagnostic quality images.

## 2023-02-15 IMAGING — DX ABDOMEN 1 VIEW
1 series · 1 of 1 positions shown · non-contrast
Comparison: CT scan of the abdomen and pelvis of 12/04/2022.

________________________________________________________________________________________________ 
ABDOMEN 1 VIEW, 02/15/2023 [DATE]: 
CLINICAL INDICATION: Constipation and diarrhea since December.

[AP]
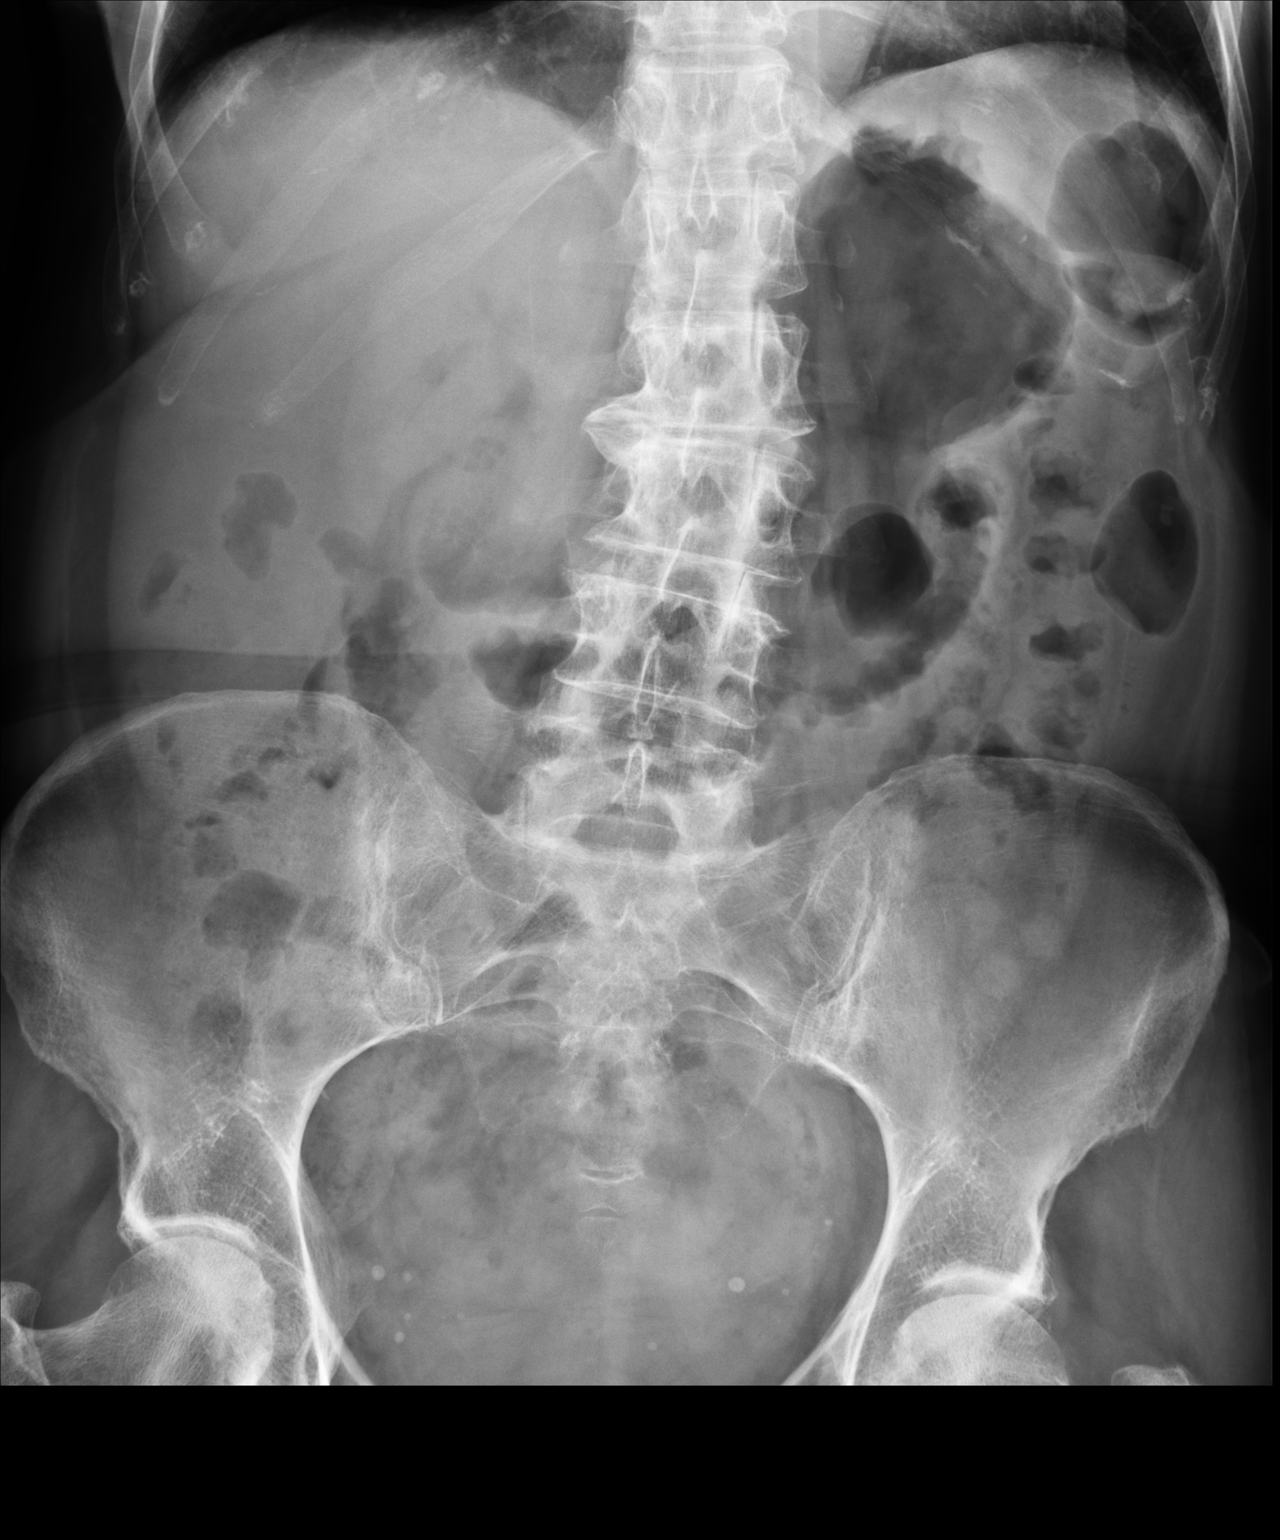

[1 of 1 positions shown; findings below may reference images not displayed]

FINDINGS: Normal abdominal bowel gas pattern. No radiopaque calculi. 
Degenerative change. Pelvic phleboliths.
IMPRESSION: No acute abnormality delineated.

## 2023-05-23 IMAGING — CT CT CHEST WITHOUT CONTRAST
2 of 4 series · 15 of 36 positions shown, 18 images · non-contrast
Comparison: CT 08/30/2022.

________________________________________________________________________________________________ 
CT CHEST WITHOUT CONTRAST, 05/23/2023 [DATE]: 
CLINICAL INDICATION: Solitary pulmonary nodule. 
A search for DICOM formatted images was conducted for prior CT imaging studies 
completed at a non-affiliated media free facility.
TECHNIQUE: The chest was scanned from base of neck through the lung bases 
without contrast on a high resolution low dose CT scanner. Routine MPR and MIP 
reconstruction images were performed.

[Series 2: chest 2.0 i31s 3 · axial · 0.64mm/px · z∈[-295,-1]mm · 12 of 163 slices shown, 15 images]
[im 8/163  mediastinal]
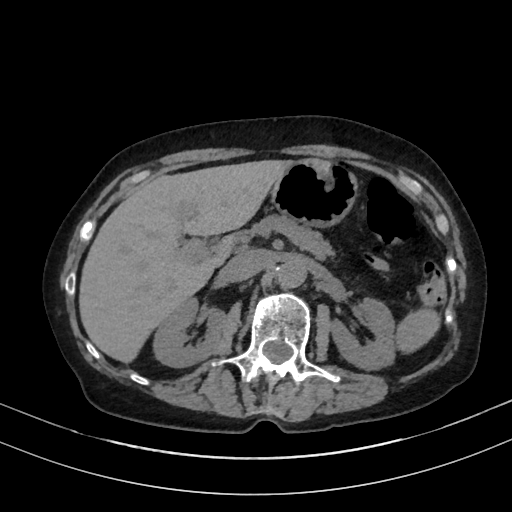
[im 8/163  lung]
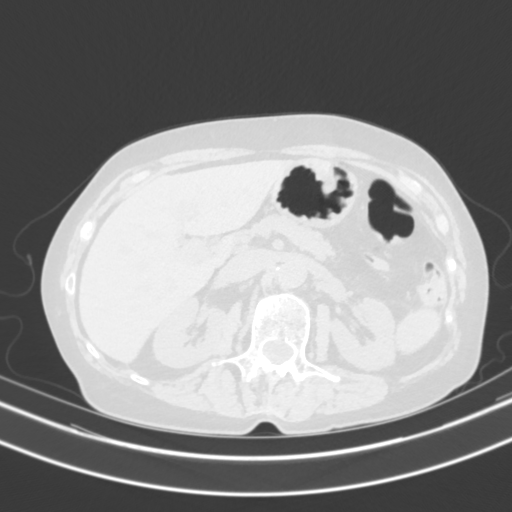
[im 24/163  lung]
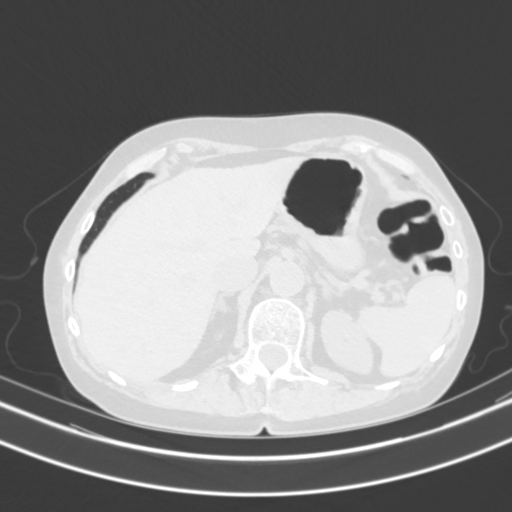
[im 39/163  lung]
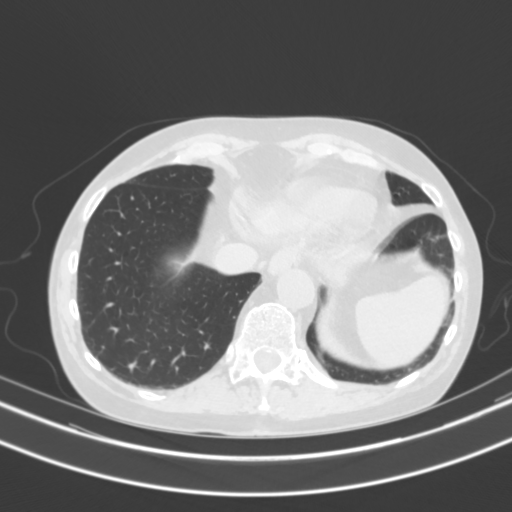
[im 47/163  lung]
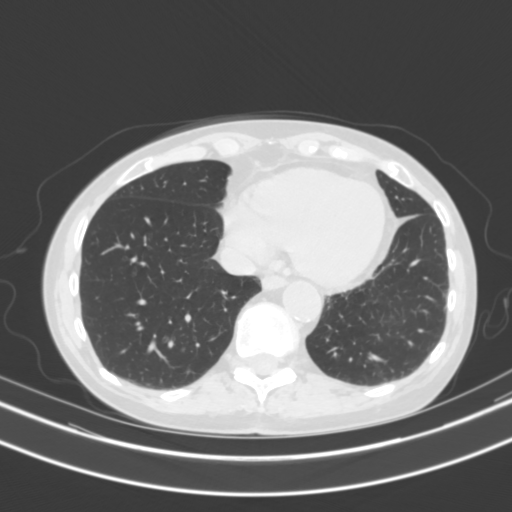
[im 62/163  mediastinal]
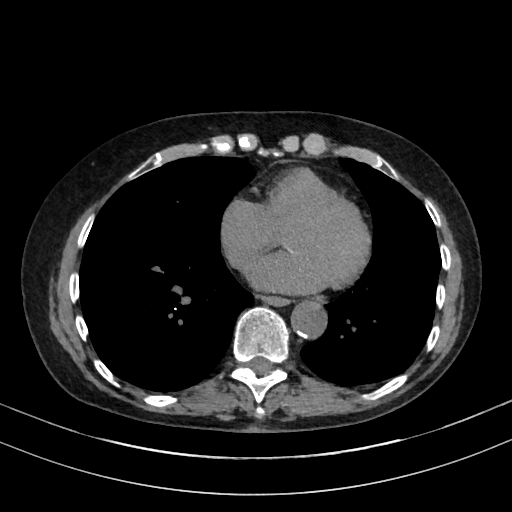
[im 62/163  lung]
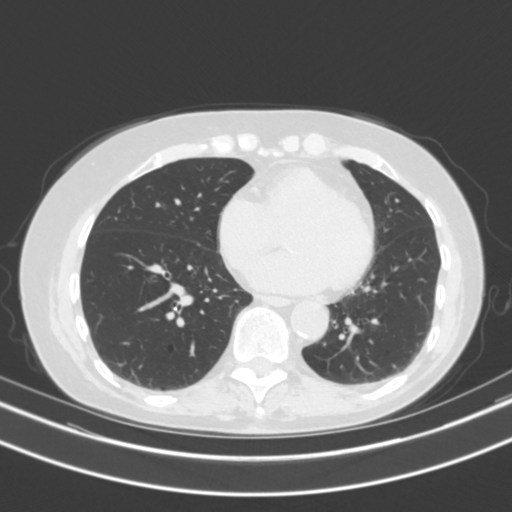
[im 78/163  lung]
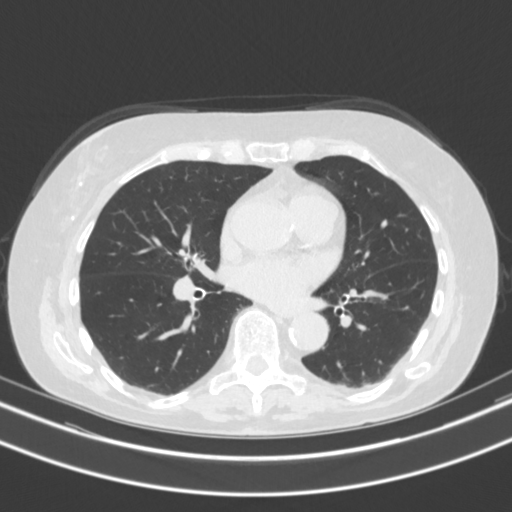
[im 85/163  lung]
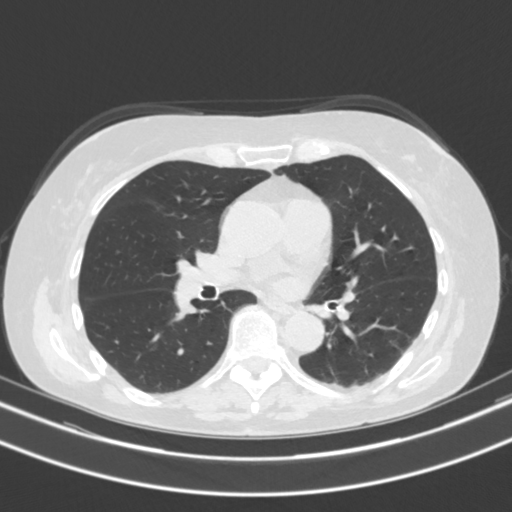
[im 101/163  lung]
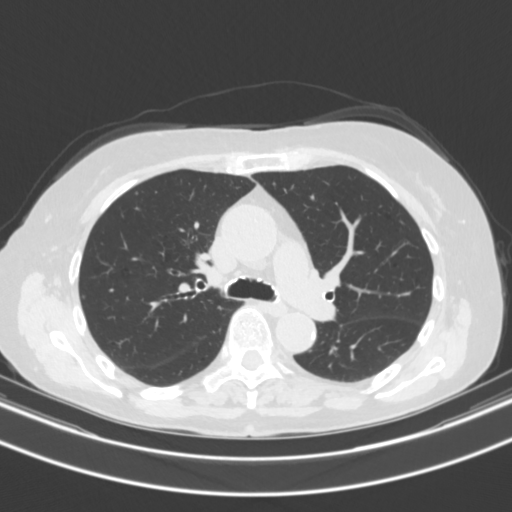
[im 116/163  mediastinal]
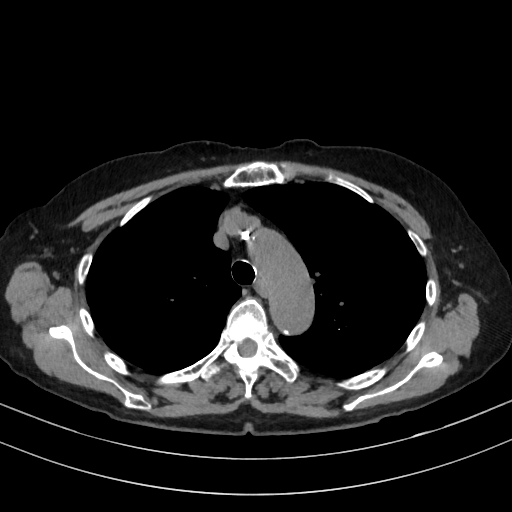
[im 116/163  lung]
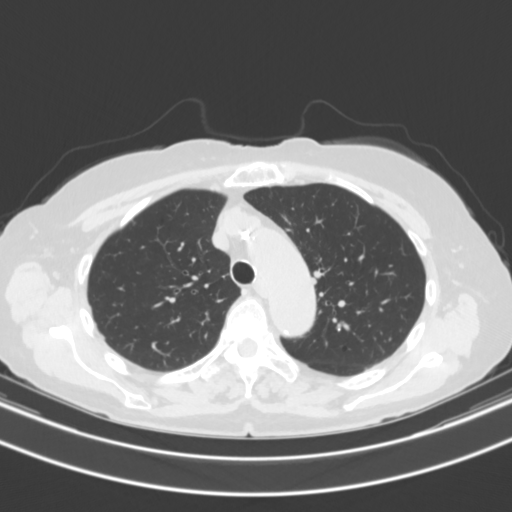
[im 124/163  lung]
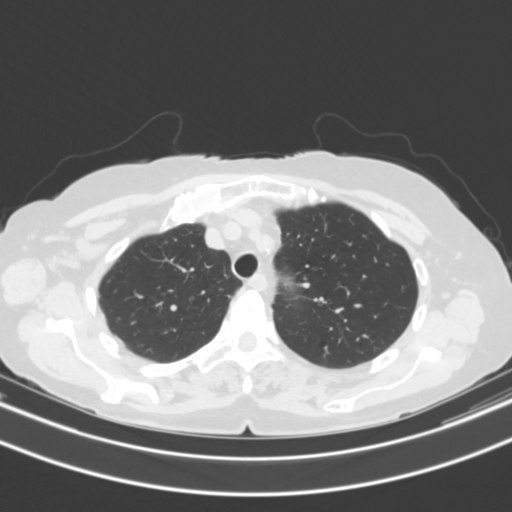
[im 139/163  lung]
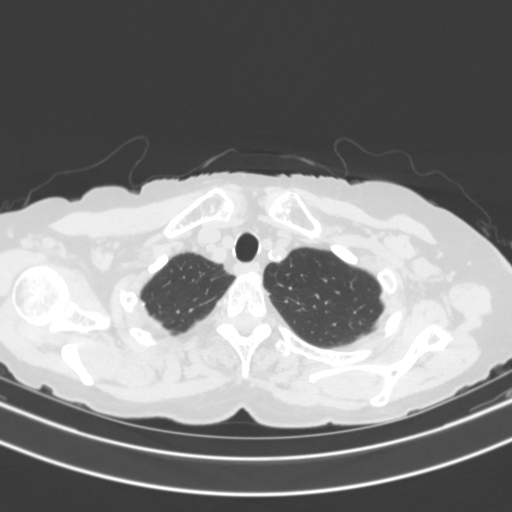
[im 155/163  lung]
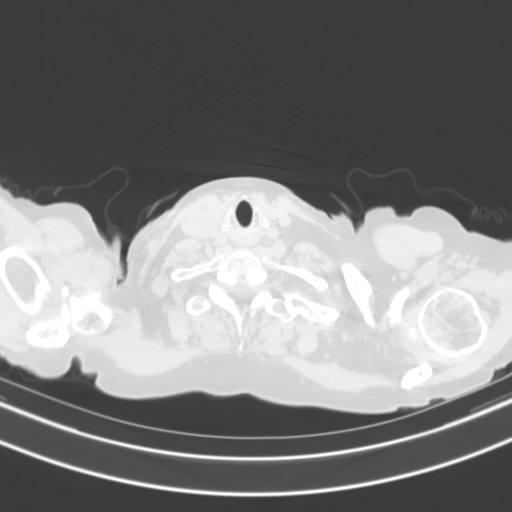

[Series 4: coronal · coronal · 0.63mm/px · 3 of 105 slices shown]
[im 21/105  lung]
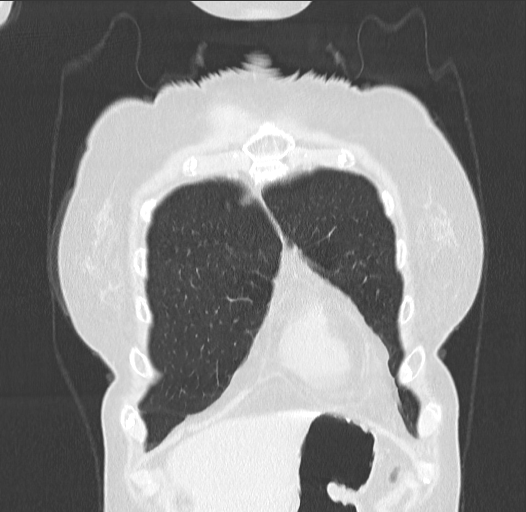
[im 42/105  lung]
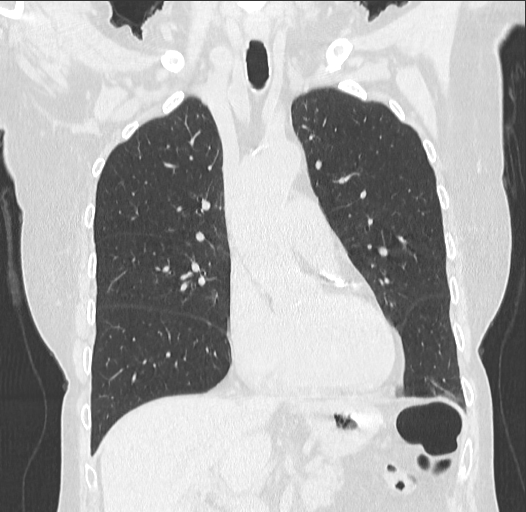
[im 63/105  lung]
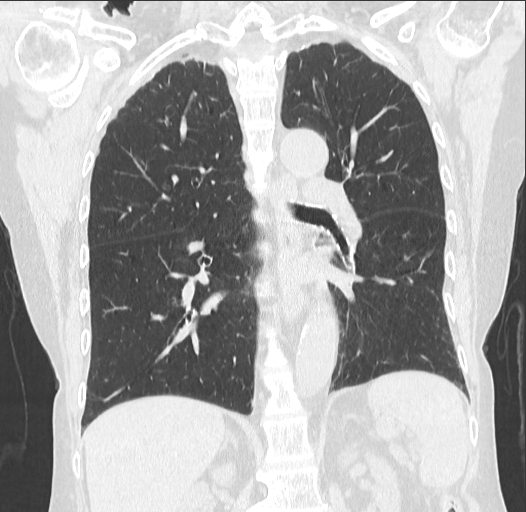

[15 of 36 positions shown; findings below may reference images not displayed]

Count of known CT and Cardiac Nuclear Medicine studies performed in the previous 
12 months = 2.
FINDINGS: LUNGS AND PLEURA:  6 mm pleural-based nodule right middle lobe is stable. 
Scattered calcified and noncalcified micronodules largest noncalcified 
micronodule measures 3 mm. No acute consolidation No pleural effusion.  
MEDIASTINUM:  No adenopathy. Normal heart size. No pericardial effusion. 
Moderate severe coronary artery calcifications noted. 
CHEST WALL/AXILLA: No mass or adenopathy.  
UPPER ABDOMEN: Negative. 
MUSCULOSKELETAL: No acute abnormality.
IMPRESSION: Stable calcified and noncalcified micronodules as well as 6 mm pleural-based 
nodule right middle lobe. Please refer to the [HOSPITAL] recommendations 
for follow-up below. 
Incidental, multiple solid nodules < 6 mm: 
Low Risk: No Routine follow-up.  
High risk: Optional CT at 12 months.  
NOTE: These recommendations do not apply to patients with immunosuppression or 
patients with known primary cancer.  
REFERENCE: [HOSPITAL] 9910 Guidelines for Management of Incidentally 
Detected Pulmonary Nodules in Adults. Radiology 9910.  
No new mediastinal or hilar adenopathy. 
In patients between the ages of 50-77 where pulmonary emphysema is noted on CT, 
recommend evaluation for low dose lung cancer screening protocol if patient is 
not already enrolled; as pulmonary emphysema is an independent risk factor for 
lung cancer. 
RADIATION DOSE REDUCTION: All CT scans are performed using radiation dose 
reduction techniques, when applicable.  Technical factors are evaluated and 
adjusted to ensure appropriate moderation of exposure.  Automated dose 
management technology is applied to adjust the radiation doses to minimize 
exposure while achieving diagnostic quality images.

## 2023-07-26 IMAGING — MG MAMMOGRAPHY SCREENING BILATERAL 3[PERSON_NAME]
8 series · 9 of 24 positions shown · non-contrast
Comparison: 07/24/2022 and exams dating back to 08/25/2012

________________________________________________________________________________________________ 
MAMMOGRAPHY SCREENING BILATERAL 3FERIENHAUS ERXLEBEN, 07/26/2023 [DATE]: 
CLINICAL INDICATION: Encounter for screening mammogram.
TECHNIQUE: Digital bilateral mammograms and 3-D Tomosynthesis were obtained. 
These were interpreted both primarily and with the aid of computer-aided 
detection system.  
BREAST DENSITY: (Level C) The breasts are heterogeneously dense, which may 
obscure small masses.

[L MLO]
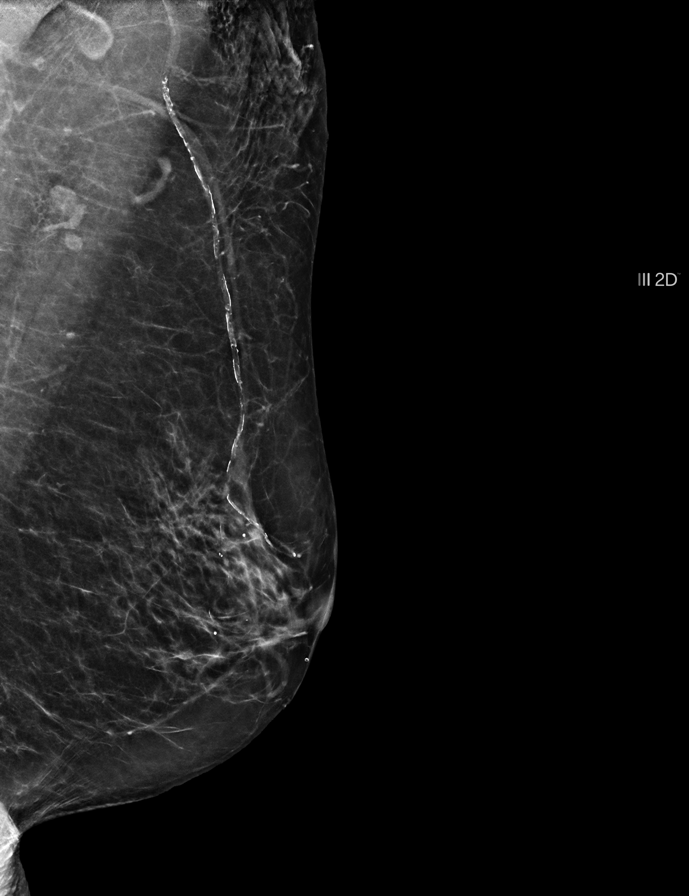

[R CC]
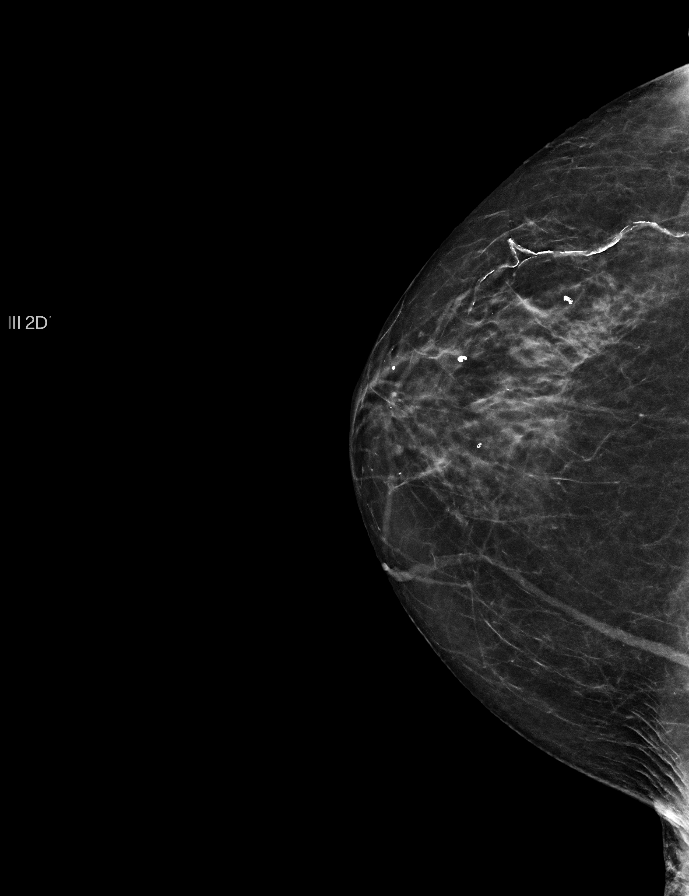

[R MLO]
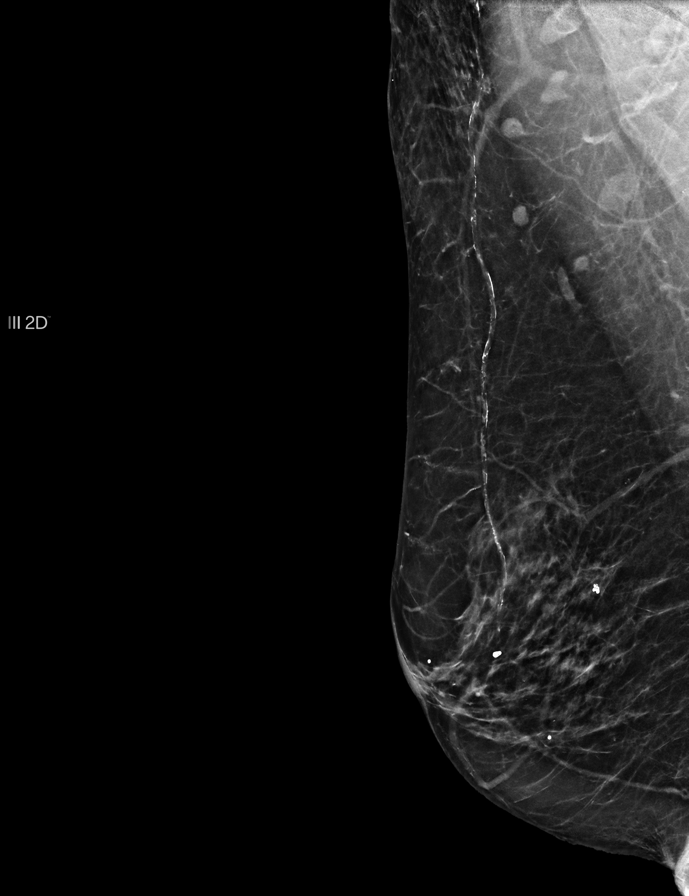

[L CC]
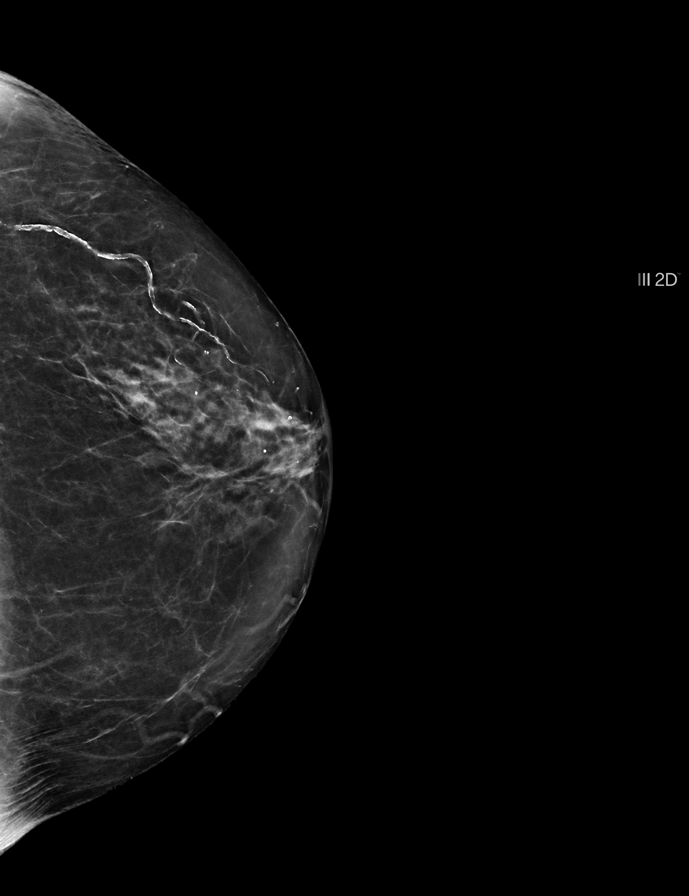

[L MLO tomo · 2 of 58 frames shown]
[frame 19/58]
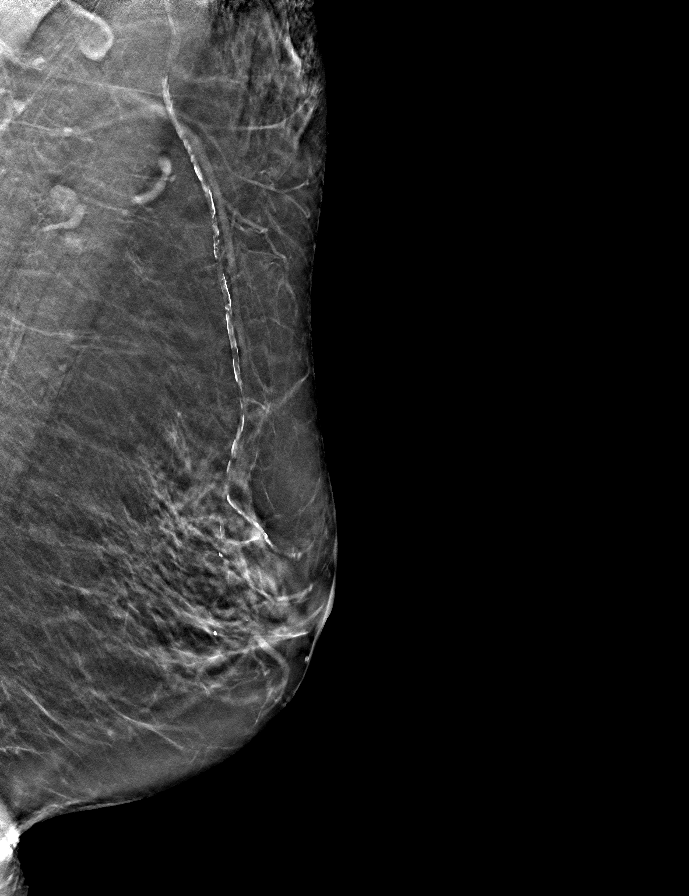
[frame 29/58]
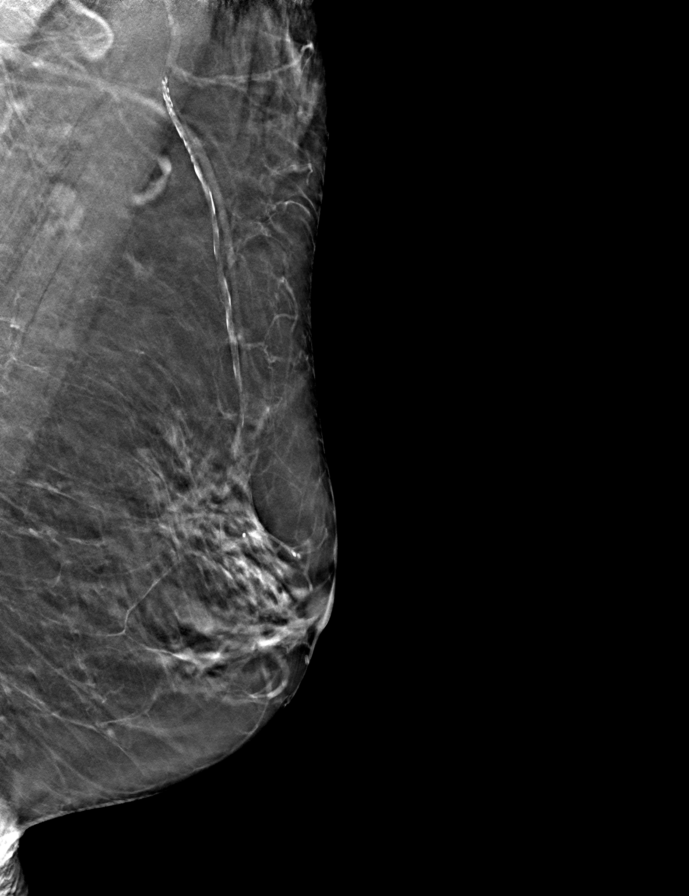

[L CC tomo · tomo slice 29/57.0]
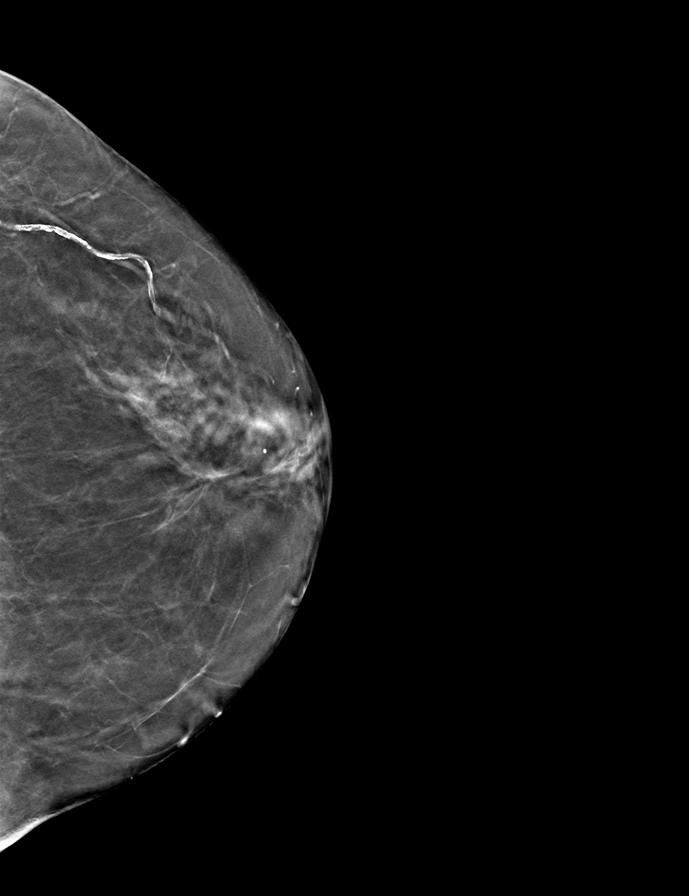

[R MLO tomo · tomo slice 30/59.0]
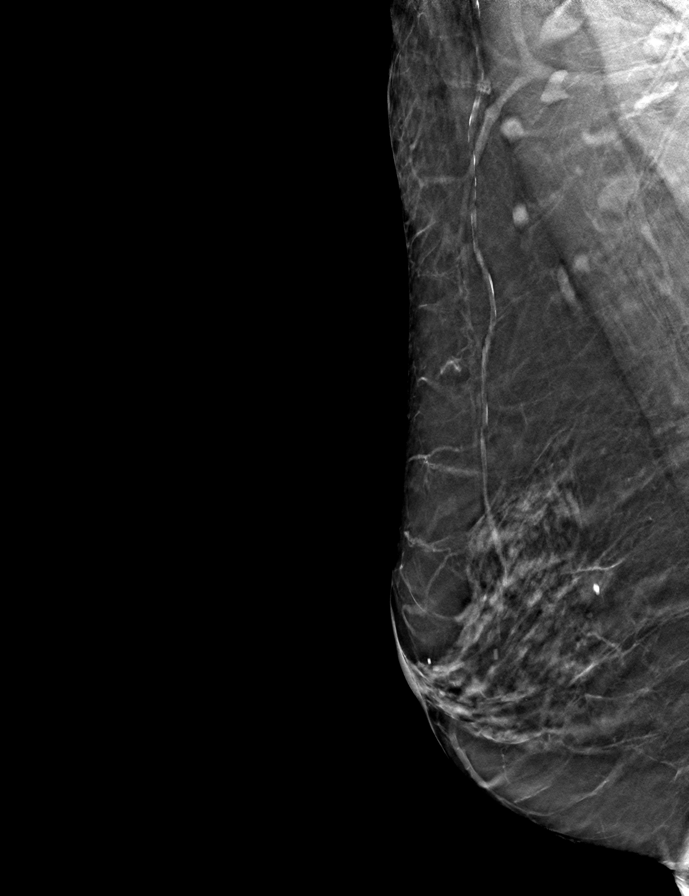

[R CC tomo · tomo slice 28/55.0]
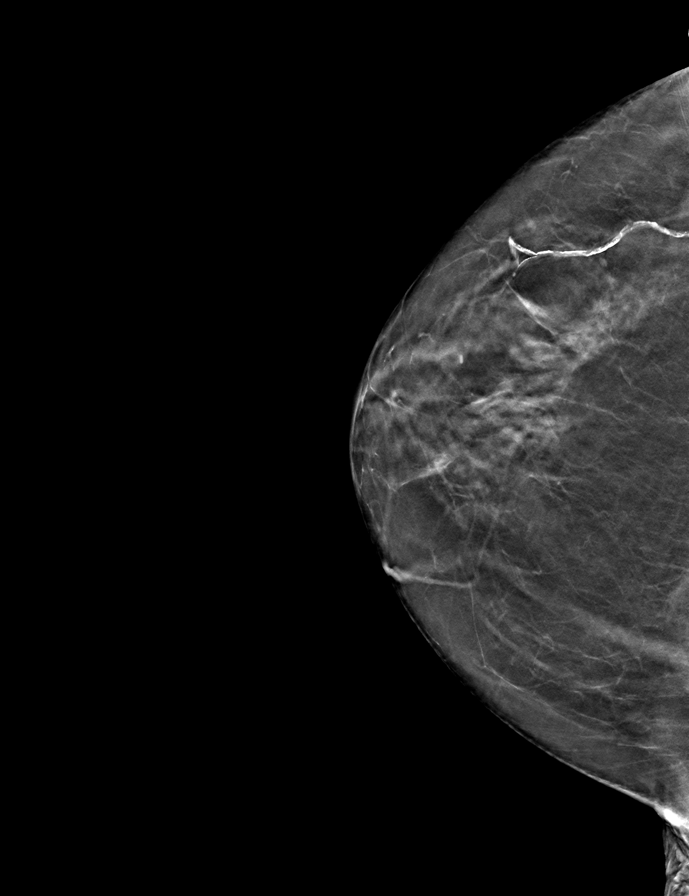

[9 of 24 positions shown; findings below may reference images not displayed]

FINDINGS: No suspicious mass, calcifications, or area of architectural 
distortion in either breast. Overall stable mammographic appearance.
IMPRESSION: No mammographic findings suggestive for malignancy. 
(BI-RADS 2) Benign findings. Routine mammographic follow-up is recommended.

## 2023-10-19 IMAGING — CT CT ABDOMEN W/WO CONTRAST
2 of 4 series · 16 of 46 positions shown, 18 images · IV contrast (ISOVUE 300)
Comparison: 12/04/2022 
Count of known CT and Cardiac Nuclear Medicine studies performed in the previous 
12 months = 2.

________________________________________________________________________________________________ 
CT ABDOMEN W/WO CONTRAST, 10/19/2023 [DATE]: 
CLINICAL INDICATION: Unspecified abdominal pain 
A search for DICOM formatted images was conducted for prior CT imaging studies 
completed at a non-affiliated media free facility.
TECHNIQUE: The abdomen was scanned from lung bases through the aortic 
bifurcation without and with 50 mL of Isovue 300 MDV injected intravenously on a 
high-resolution CT scanner using dose reduction techniques. Routine MPR 
reconstructions were performed.

[Series 5: portal with 3.0 i41s 2 · axial · portal-venous · 0.69mm/px · z∈[-318,-102]mm · 13 of 84 slices shown, 15 images]
[im 6/84  soft-tissue]
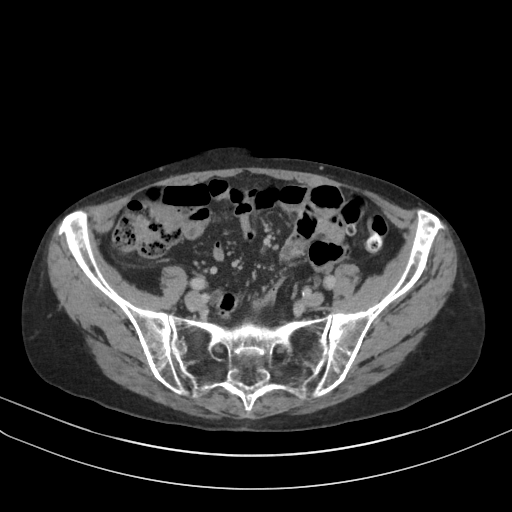
[im 6/84  bone]
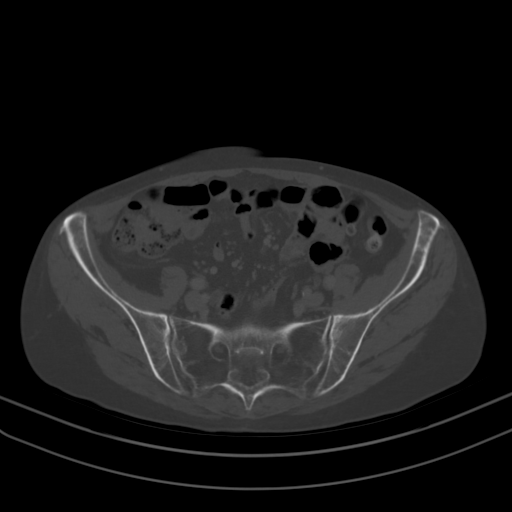
[im 12/84  soft-tissue]
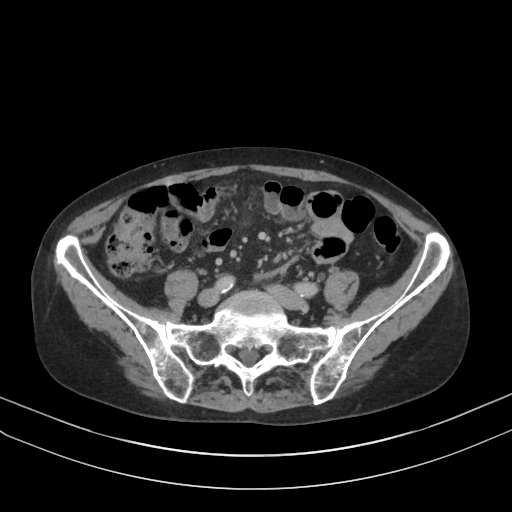
[im 17/84  soft-tissue]
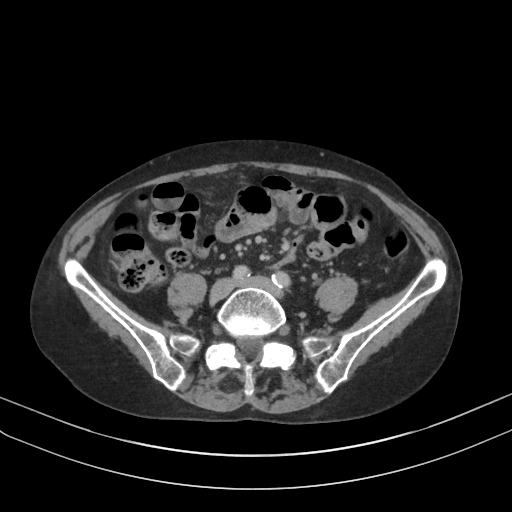
[im 23/84  soft-tissue]
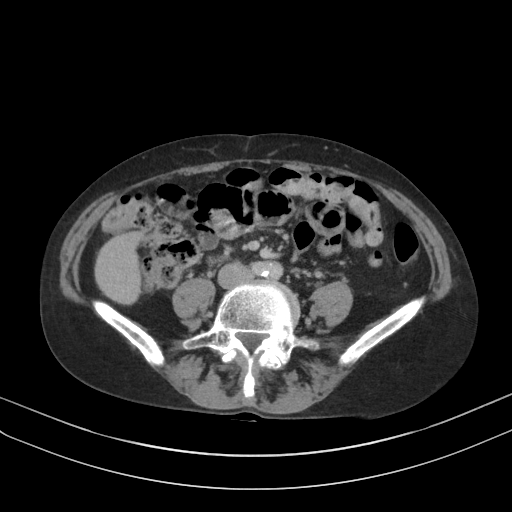
[im 28/84  soft-tissue]
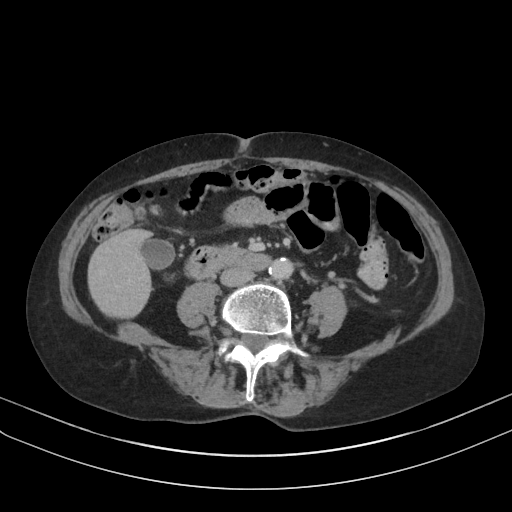
[im 34/84  soft-tissue]
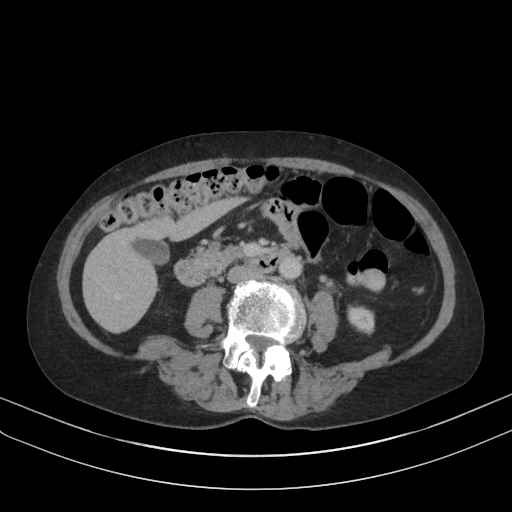
[im 45/84  soft-tissue]
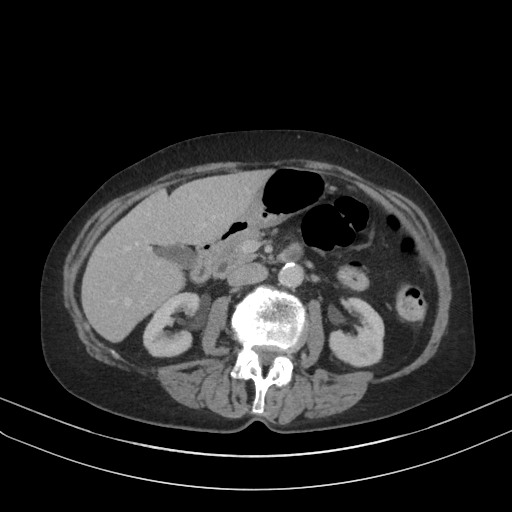
[im 50/84  soft-tissue]
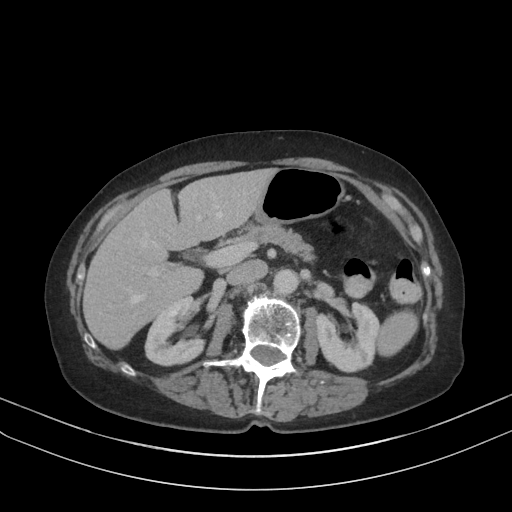
[im 56/84  soft-tissue]
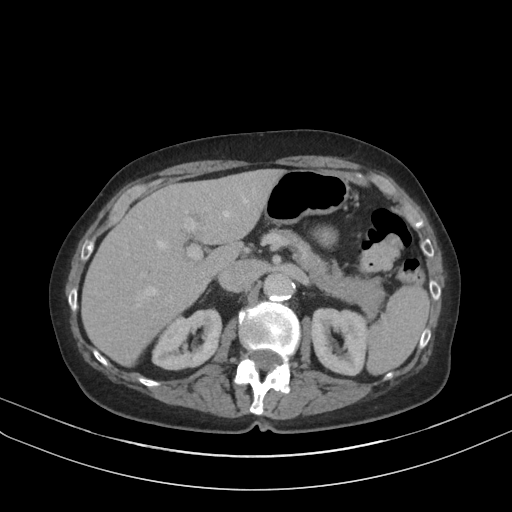
[im 56/84  bone]
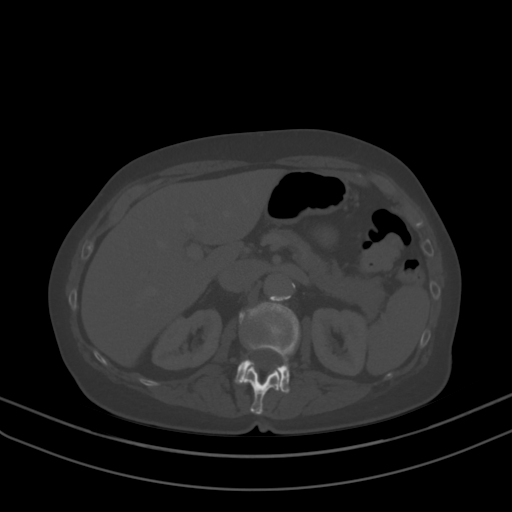
[im 61/84  soft-tissue]
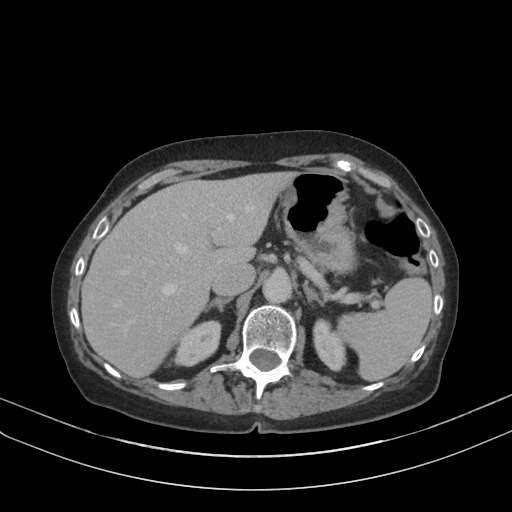
[im 67/84  soft-tissue]
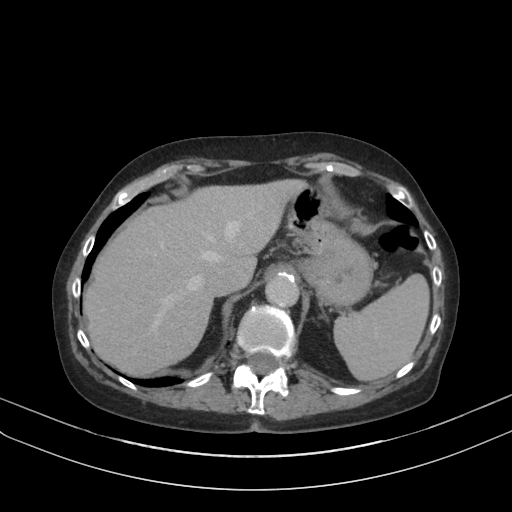
[im 72/84  soft-tissue]
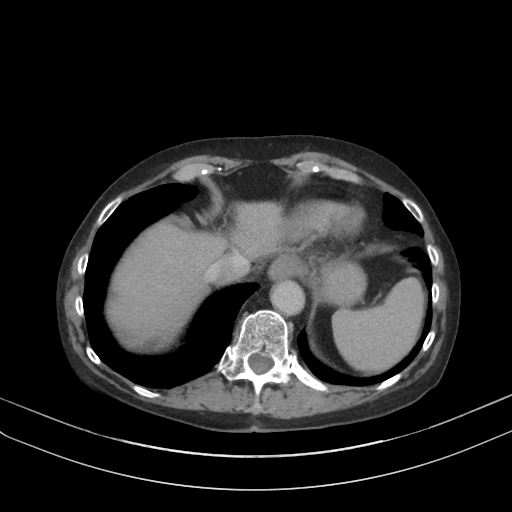
[im 78/84  soft-tissue]
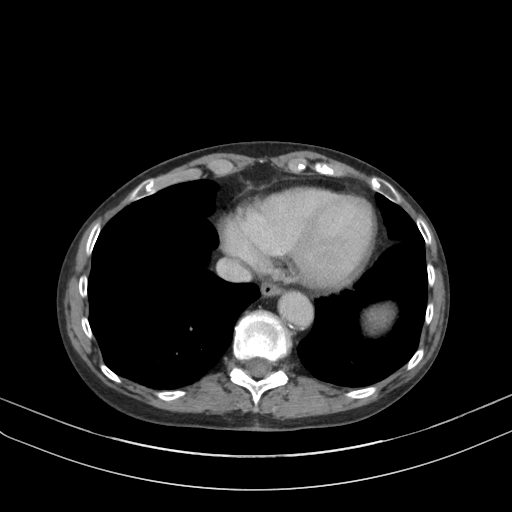

[Series 6: cor from thins · coronal · 0.49mm/px · 3 of 106 slices shown]
[im 36/106  soft-tissue]
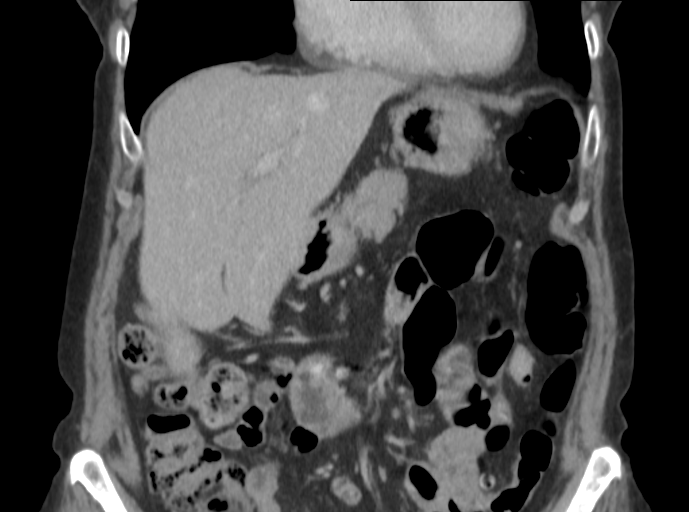
[im 47/106  soft-tissue]
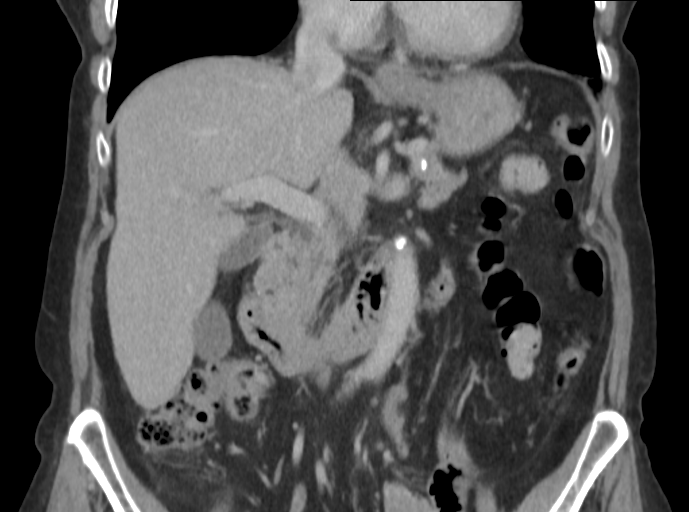
[im 59/106  soft-tissue]
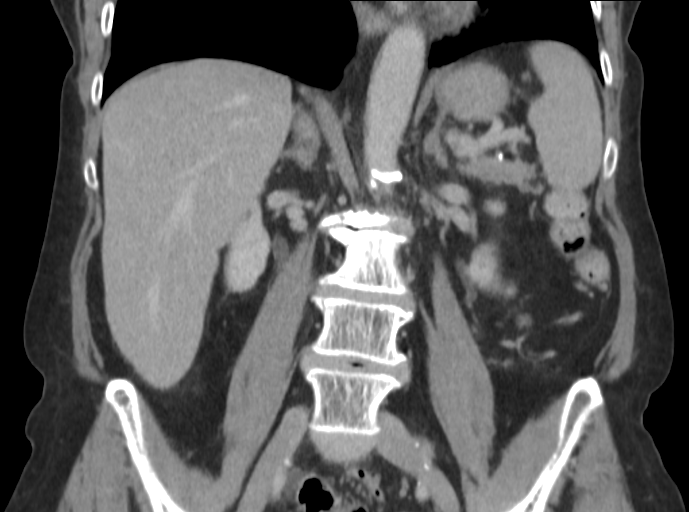

[16 of 46 positions shown; findings below may reference images not displayed]

FINDINGS: LUNG BASES: Lung bases are clear. No pleural effusions. 
HEPATOBILIARY: No mass or biliary dilatation. No gallstones. 
SPLEEN: Normal in size. 
PANCREAS: No ductal dilatation or mass.   
ADRENALS: No mass. 
KIDNEYS: No enhancing mass or hydronephrosis. 
LYMPH NODES: No adenopathy. 
STOMACH, SMALL BOWEL AND COLON: No bowel wall thickening or obstruction. 
VASCULAR STRUCTURES: No aneurysm. Atherosclerosis and coronary arterial 
calcifications. 
MUSCULOSKELETAL: No acute osseous abnormality. Scattered degenerative changes.
IMPRESSION: 1.  No bowel wall thickening or obstruction. 
2.  Atherosclerosis and coronary arterial calcifications. 
3.  Degenerative change. 
RADIATION DOSE REDUCTION: All CT scans are performed using radiation dose 
reduction techniques, when applicable.  Technical factors are evaluated and 
adjusted to ensure appropriate moderation of exposure.  Automated dose 
management technology is applied to adjust the radiation doses to minimize 
exposure while achieving diagnostic quality images.

## 2023-10-25 IMAGING — MR MRI PITUITARY W/WO CONTRAST
10 of 16 series · 27 of 48 positions shown · IV contrast (gadavist)
Comparison: MRI pituitary October 12, 2022, MRI pituitary October 24, 2021 and

________________________________________________________________________________________________ 
MRI PITUITARY W/WO CONTRAST, 10/25/2023 [DATE]: 
CLINICAL INDICATION: Benign neoplasm of cerebral meninges , known lesion left 
cavernous sinus. Follow-up study. Headache.
TECHNIQUE: MRI performed through the brain without and with contrast utilizing 
pituitary protocol.  5.5 mL of Gadavist injected intravenously.  2 mL of 
Gadavist discarded. Patient was scanned on a 3T magnet.

[Series 401: FLAIR fat-sat · axial · 5.0mm · 0.60mm/px · z∈[-65,+92]mm · 3 of 28 slices shown]
[im 1/28]
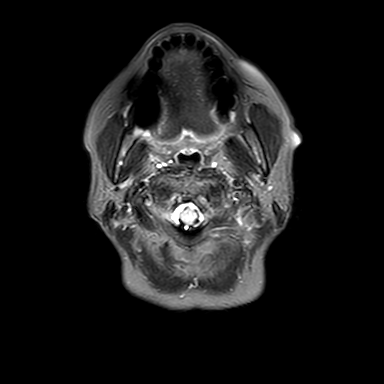
[im 14/28]
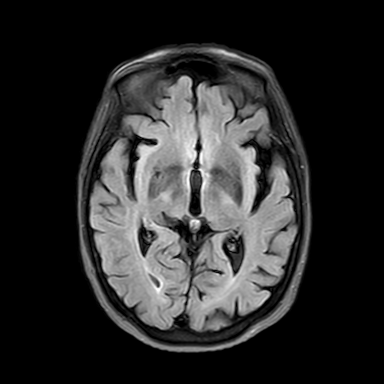
[im 28/28]
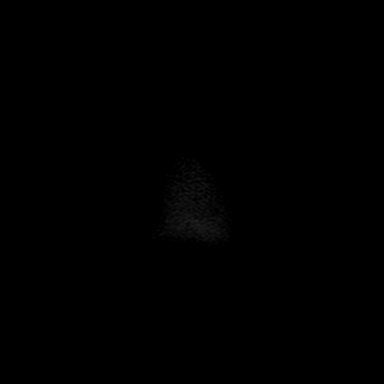

[Series 501: SWI · axial · 3.0mm · 0.53mm/px · z∈[-59,+85]mm · 8 of 100 slices shown (1 of 2)]
[im 1/100]
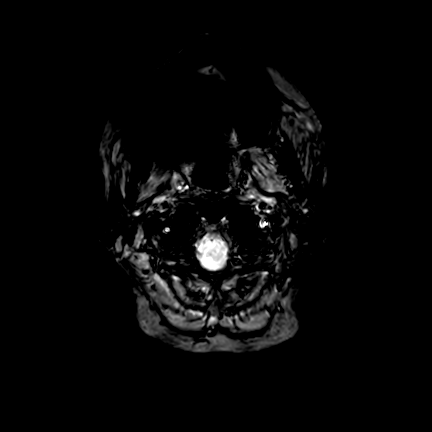
[im 13/100]
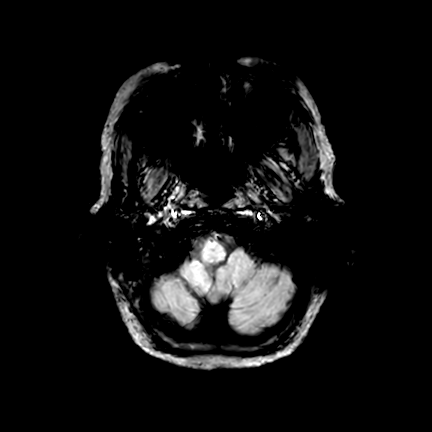
[im 25/100]
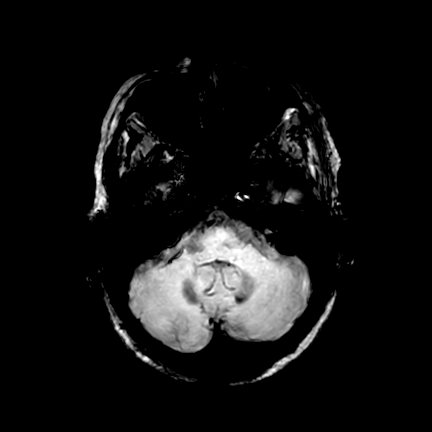
[im 38/100]
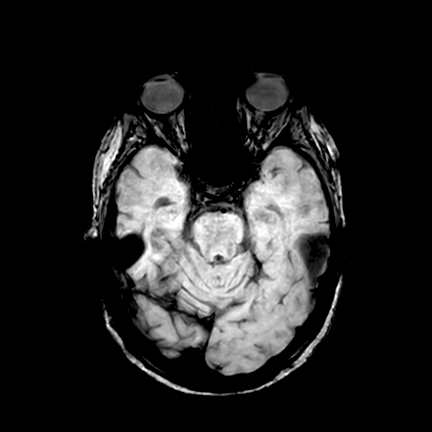
[im 62/100]
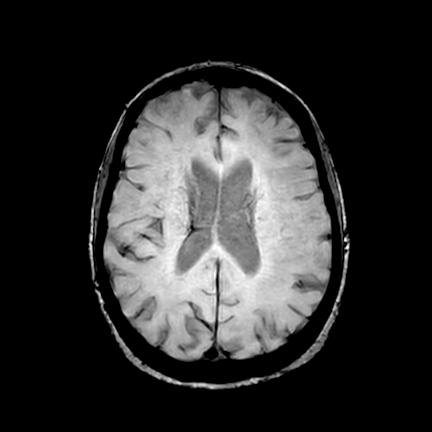
[im 75/100]
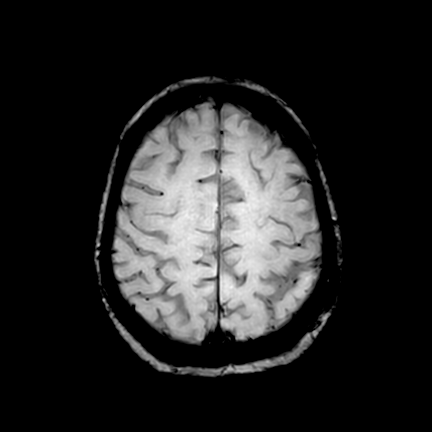
[im 87/100]
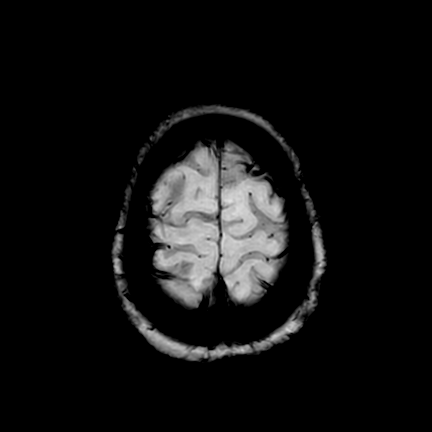
[im 100/100]
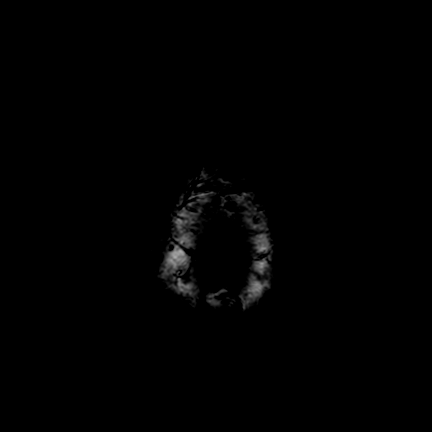

[Series 502: SWI · axial · 10.0mm · 0.53mm/px · z∈[-60,+90]mm · 7 of 78 slices shown (2 of 2)]
[im 1/78]
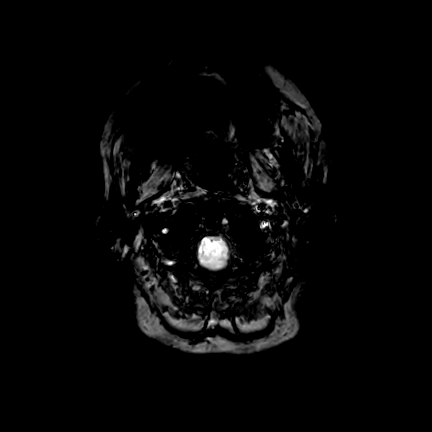
[im 13/78]
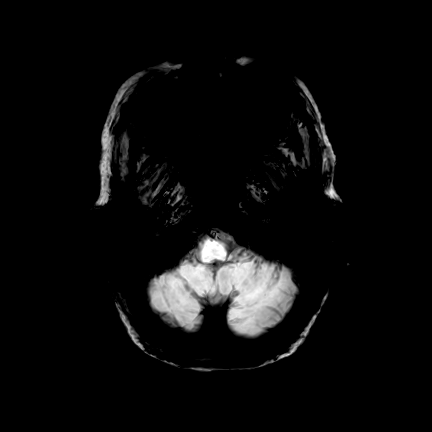
[im 26/78]
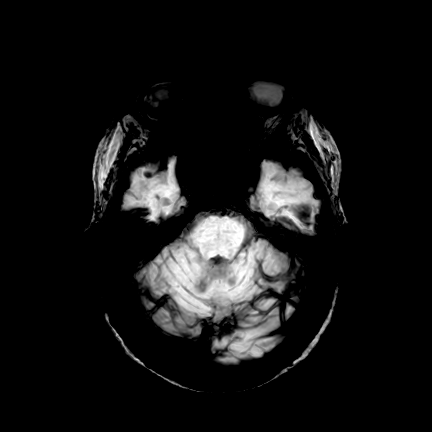
[im 39/78]
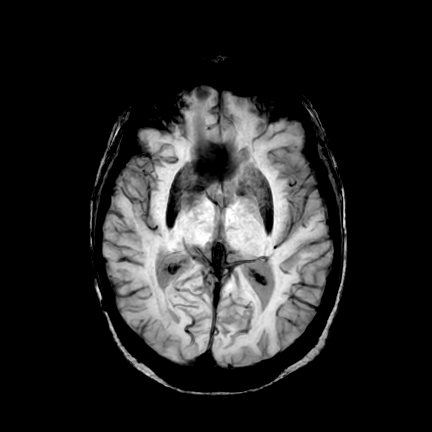
[im 52/78]
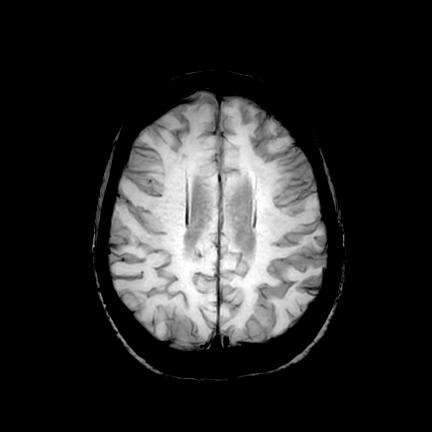
[im 65/78]
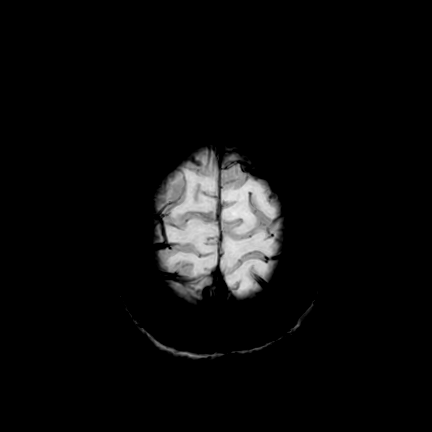
[im 78/78]
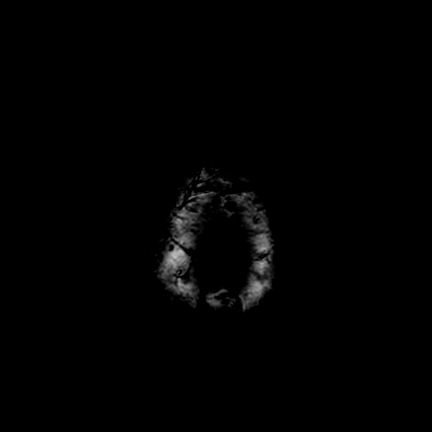

[Series 601: T1 · sagittal · 2.0mm · 0.38mm/px · 1 of 13 slices shown (1 of 2)]
[im 1/13]
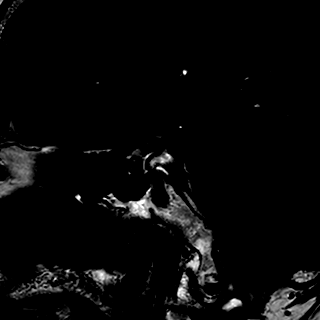

[Series 701: T2 · sagittal · 2.5mm · 0.34mm/px · 1 of 15 slices shown (1 of 2)]
[im 1/15]
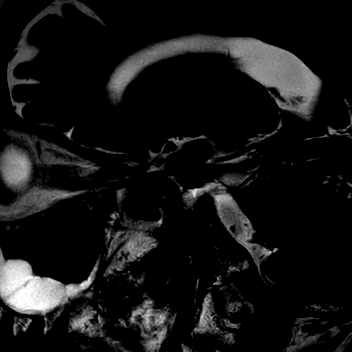

[Series 801: T1 · coronal · 2.5mm · 0.38mm/px · 1 of 15 slices shown (2 of 2)]
[im 1/15]
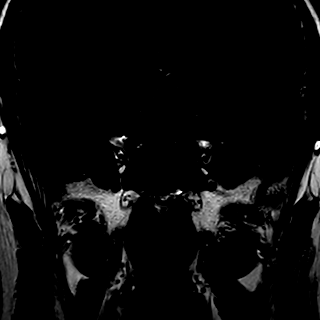

[Series 901: T2 · coronal · 2.5mm · 0.34mm/px · 1 of 15 slices shown (2 of 2)]
[im 1/15]
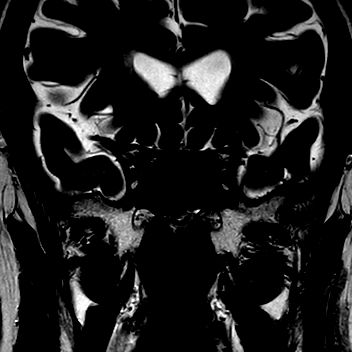

[Series 1101: T1 post-contrast · sagittal · 2.0mm · 0.38mm/px · 1 of 13 slices shown (1 of 3)]
[im 1/13]
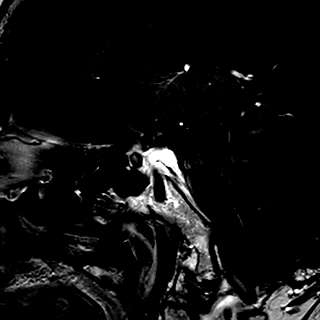

[Series 1201: T1 post-contrast · coronal · 2.5mm · 0.38mm/px · 1 of 15 slices shown (2 of 3)]
[im 1/15]
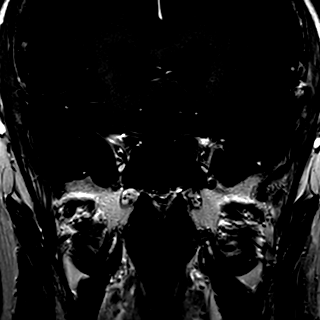

[Series 1301: T1 post-contrast · axial · 5.0mm · 0.48mm/px · z∈[-65,+92]mm · 3 of 28 slices shown (3 of 3)]
[im 1/28]
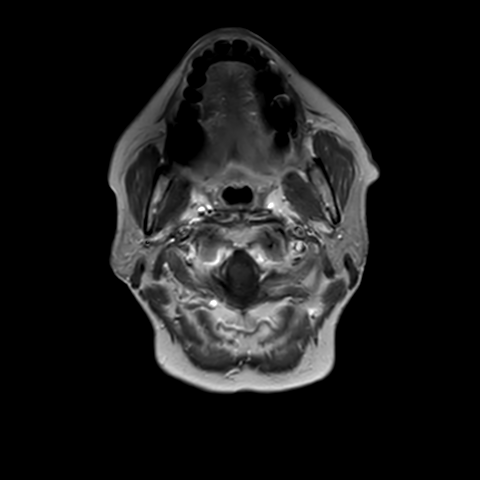
[im 14/28]
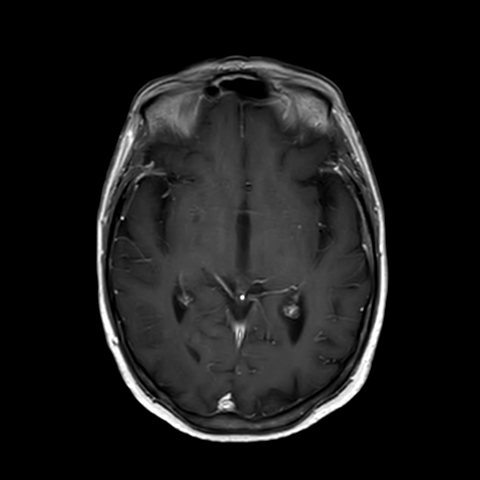
[im 28/28]
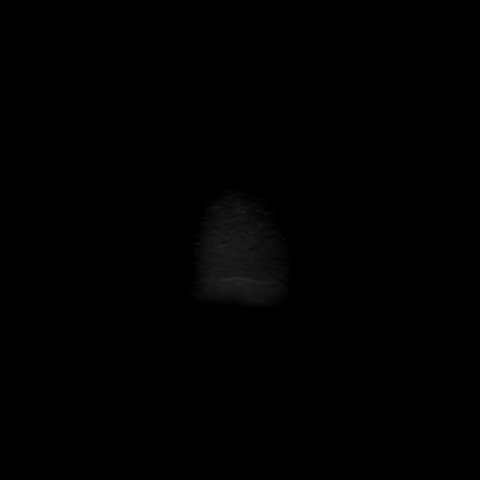

[27 of 48 positions shown; findings below may reference images not displayed]

FINDINGS: -------------------------------------------------------------------------------- 
------------ 
SELLA: 
Best seen on the dynamic coronal sequence is a stable appearing 5 mm AP by 5 mm 
transverse by 5 mm craniocaudal hypoenhancing process in the central 
adenohypophysis. Again, there is no evidence of suprasellar extension. The optic 
chiasm appears unremarkable. The sella is not expanded. No evidence of cavernous 
sinus invasion. 
The previously noted asymmetric enhancement involving the left superolateral 
cavernous sinus region also appears stable measuring 6 x 3 mm, as measured on 
coronal image 9 of series 9029. This is demonstrated stability dating back to a 
study dated April 13, 2021. It may reflect a small meningioma or schwannoma in this 
location. 
Dedicated imaging of the sella demonstrates otherwise normal size, signal and 
enhancement characteristics of the pituitary gland. No pituitary hemorrhage 
Normal pituitary stalk and suprasellar structures including the optic chiasm. 
Normal right cavernous sinus. 
-------------------------------------------------------------------------------- 
----------- 
INTRACRANIAL: 
Stable appearing mild nonspecific periventricular and deep white matter T2 FLAIR 
hyperintensity likely chronic microangiopathy. Similar stable findings within 
the right paramedian pons. No abnormal extra-axial fluid collection. 

No acute ischemia. No abnormal foci of susceptibility artifact in the brain. 
Patency of the intracranial vascular flow voids.  No acute intracranial 
hemorrhage, mass effect, midline shift. No hydrocephalus. Cerebral volume is age 
appropriate. No pathologic enhancement.   
-------------------------------------------------------------------------------- 
---------- 
OTHER 
ORBITS/SINUSES/T-BONES:  Bilateral pseudophakia.  Mastoid air cells and middle 
ear cavities are grossly clear.  Left maxillary sinus membrane thickening. 
MARROW SIGNAL/SOFT TISSUES: No focal suspect signal abnormality.  
-------------------------------------------------------------------------------- 
----------
IMPRESSION: Stable hypoenhancing process within the central adenohypophysis, likely 
reflective of pituitary microadenoma. No mass effect. 
Stable subtle asymmetric enhancement involving the left superolateral cavernous 
sinus region which may reflect a small meningioma or schwannoma. This is stable 
dating back to study dated April 13, 2021. 
No acute intracranial process.
